# Patient Record
Sex: Female | Born: 1985 | Race: White | Hispanic: No
Health system: Midwestern US, Academic
[De-identification: ages and names within clinical notes are randomized; demographics above are authoritative.]

---

## 2017-03-14 ENCOUNTER — Encounter: Admit: 2017-03-14 | Discharge: 2017-03-14

## 2017-05-10 ENCOUNTER — Encounter: Admit: 2017-05-10 | Discharge: 2017-05-10

## 2017-05-10 DIAGNOSIS — I2510 Atherosclerotic heart disease of native coronary artery without angina pectoris: ICD-10-CM

## 2017-05-10 DIAGNOSIS — F909 Attention-deficit hyperactivity disorder, unspecified type: ICD-10-CM

## 2017-05-10 DIAGNOSIS — F419 Anxiety disorder, unspecified: ICD-10-CM

## 2017-05-10 DIAGNOSIS — IMO0002 Unspecified mental or behavioral problem: ICD-10-CM

## 2017-05-10 DIAGNOSIS — R011 Cardiac murmur, unspecified: Principal | ICD-10-CM

## 2017-05-10 DIAGNOSIS — F329 Major depressive disorder, single episode, unspecified: ICD-10-CM

## 2017-05-10 DIAGNOSIS — R6889 Other general symptoms and signs: ICD-10-CM

## 2017-05-10 DIAGNOSIS — E282 Polycystic ovarian syndrome: ICD-10-CM

## 2017-05-10 DIAGNOSIS — F3181 Bipolar II disorder: ICD-10-CM

## 2017-05-10 DIAGNOSIS — R7303 Prediabetes: ICD-10-CM

## 2017-05-10 NOTE — Telephone Encounter
Date verified: 05/10/17  Verification Source/Phone# (if applicable): [phone] PAT  Reference #: [id number]  In Network for Facility: YES  Ins Plan and ID#: [place] ID # [id number]  Group #: [id number]  Subscriber name: ALIEGHA PAULLIN  Subscriber DOB: 13-Jan-1986  Effective Date: 01/13/14  Deductible: NA                       Amount Met:  NA  OOP:  NA                                  Amount Met: NA  Coinsurance rate: 100% CONTRACTED RATE   OV/Spec Copay: NA                                            Applied to OOP: Y/N NO  LTM (if applicable): UNLIMITED  Auth required: Y/N YES  CPT/HCPCS: 29924  [id number]  Name of drug/procedure: [place] for Auth/Referral:[facility] [place]   Phone (if applies): [phone]   [phone]  Auth #: NA  Authorization Dates to/from: NA

## 2017-07-30 ENCOUNTER — Encounter: Admit: 2017-07-30 | Discharge: 2017-07-30

## 2017-07-30 ENCOUNTER — Ambulatory Visit: Admit: 2017-07-30 | Discharge: 2017-07-30 | Payer: MEDICAID

## 2017-07-30 DIAGNOSIS — F3181 Bipolar II disorder: ICD-10-CM

## 2017-07-30 DIAGNOSIS — F909 Attention-deficit hyperactivity disorder, unspecified type: ICD-10-CM

## 2017-07-30 DIAGNOSIS — R7303 Prediabetes: ICD-10-CM

## 2017-07-30 DIAGNOSIS — E282 Polycystic ovarian syndrome: ICD-10-CM

## 2017-07-30 DIAGNOSIS — IMO0002 Unspecified mental or behavioral problem: ICD-10-CM

## 2017-07-30 DIAGNOSIS — I2510 Atherosclerotic heart disease of native coronary artery without angina pectoris: ICD-10-CM

## 2017-07-30 DIAGNOSIS — K219 Gastro-esophageal reflux disease without esophagitis: ICD-10-CM

## 2017-07-30 DIAGNOSIS — R6889 Other general symptoms and signs: ICD-10-CM

## 2017-07-30 DIAGNOSIS — R011 Cardiac murmur, unspecified: Principal | ICD-10-CM

## 2017-07-30 DIAGNOSIS — F419 Anxiety disorder, unspecified: ICD-10-CM

## 2017-07-30 DIAGNOSIS — F329 Major depressive disorder, single episode, unspecified: ICD-10-CM

## 2017-08-16 ENCOUNTER — Ambulatory Visit: Admit: 2017-08-16 | Discharge: 2017-08-16 | Payer: MEDICAID

## 2017-08-16 ENCOUNTER — Encounter: Admit: 2017-08-16 | Discharge: 2017-08-16

## 2017-08-16 DIAGNOSIS — F4310 Post-traumatic stress disorder, unspecified: ICD-10-CM

## 2017-08-16 DIAGNOSIS — F319 Bipolar disorder, unspecified: ICD-10-CM

## 2017-08-16 DIAGNOSIS — Z7189 Other specified counseling: Principal | ICD-10-CM

## 2017-10-02 ENCOUNTER — Encounter: Admit: 2017-10-02 | Discharge: 2017-10-02

## 2017-10-23 ENCOUNTER — Encounter: Admit: 2017-10-23 | Discharge: 2017-10-23

## 2017-10-30 ENCOUNTER — Encounter: Admit: 2017-10-30 | Discharge: 2017-10-30

## 2017-11-15 ENCOUNTER — Ambulatory Visit: Admit: 2017-11-15 | Discharge: 2017-11-15 | Payer: MEDICAID

## 2017-11-15 DIAGNOSIS — Z7189 Other specified counseling: Principal | ICD-10-CM

## 2017-11-15 DIAGNOSIS — F4310 Post-traumatic stress disorder, unspecified: ICD-10-CM

## 2017-11-15 DIAGNOSIS — F319 Bipolar disorder, unspecified: ICD-10-CM

## 2017-11-20 ENCOUNTER — Encounter: Admit: 2017-11-20 | Discharge: 2017-11-20

## 2017-11-28 ENCOUNTER — Encounter: Admit: 2017-11-28 | Discharge: 2017-11-28

## 2017-11-28 DIAGNOSIS — Z9884 Bariatric surgery status: Principal | ICD-10-CM

## 2017-12-17 ENCOUNTER — Encounter: Admit: 2017-12-17 | Discharge: 2017-12-17

## 2017-12-17 DIAGNOSIS — Z01818 Encounter for other preprocedural examination: Principal | ICD-10-CM

## 2017-12-17 MED ORDER — CEFAZOLIN INJ 1GM IVP
3 g | Freq: Once | INTRAVENOUS | 0 refills | Status: CN
Start: 2017-12-17 — End: ?

## 2017-12-17 MED ORDER — FAMOTIDINE (PF) 20 MG/2 ML IV SOLN
20 mg | Freq: Once | INTRAVENOUS | 0 refills | Status: CN
Start: 2017-12-17 — End: ?

## 2017-12-17 MED ORDER — ACETAMINOPHEN 10 MG/ML IVPB
1000 mg | Freq: Once | INTRAVENOUS | 0 refills | Status: CN
Start: 2017-12-17 — End: ?

## 2018-01-07 ENCOUNTER — Encounter: Admit: 2018-01-07 | Discharge: 2018-01-07

## 2018-01-07 ENCOUNTER — Ambulatory Visit: Admit: 2018-01-07 | Discharge: 2018-01-08 | Payer: MEDICAID

## 2018-01-07 ENCOUNTER — Ambulatory Visit: Admit: 2018-01-07 | Discharge: 2018-01-07

## 2018-01-07 ENCOUNTER — Inpatient Hospital Stay: Admit: 2018-01-07 | Discharge: 2018-01-07

## 2018-01-07 DIAGNOSIS — IMO0002 Unspecified mental or behavioral problem: ICD-10-CM

## 2018-01-07 DIAGNOSIS — F603 Borderline personality disorder: ICD-10-CM

## 2018-01-07 DIAGNOSIS — F419 Anxiety disorder, unspecified: ICD-10-CM

## 2018-01-07 DIAGNOSIS — E282 Polycystic ovarian syndrome: ICD-10-CM

## 2018-01-07 DIAGNOSIS — A4902 Methicillin resistant Staphylococcus aureus infection, unspecified site: ICD-10-CM

## 2018-01-07 DIAGNOSIS — R011 Cardiac murmur, unspecified: Principal | ICD-10-CM

## 2018-01-07 DIAGNOSIS — R6889 Other general symptoms and signs: ICD-10-CM

## 2018-01-07 DIAGNOSIS — R000 Tachycardia, unspecified: ICD-10-CM

## 2018-01-07 DIAGNOSIS — F4310 Post-traumatic stress disorder, unspecified: ICD-10-CM

## 2018-01-07 DIAGNOSIS — F329 Major depressive disorder, single episode, unspecified: ICD-10-CM

## 2018-01-07 DIAGNOSIS — R7303 Prediabetes: ICD-10-CM

## 2018-01-07 DIAGNOSIS — I2510 Atherosclerotic heart disease of native coronary artery without angina pectoris: ICD-10-CM

## 2018-01-07 DIAGNOSIS — K929 Disease of digestive system, unspecified: ICD-10-CM

## 2018-01-07 DIAGNOSIS — F909 Attention-deficit hyperactivity disorder, unspecified type: ICD-10-CM

## 2018-01-07 DIAGNOSIS — Z87898 Personal history of other specified conditions: ICD-10-CM

## 2018-01-07 DIAGNOSIS — Z01818 Encounter for other preprocedural examination: Secondary | ICD-10-CM

## 2018-01-07 LAB — COMPREHENSIVE METABOLIC PANEL
Lab: 0.4 mg/dL (ref 0.3–1.2)
Lab: 0.5 mg/dL — ABNORMAL LOW (ref >60)
Lab: 103 MMOL/L — ABNORMAL HIGH (ref 98–110)
Lab: 12 U/L (ref 7–40)
Lab: 137 MMOL/L (ref 137–147)
Lab: 21 mg/dL — ABNORMAL LOW (ref >60)
Lab: 3.6 MMOL/L — ABNORMAL LOW (ref 3.5–5.1)
Lab: 4.4 g/dL (ref 3.5–5.0)
Lab: 7.7 g/dL (ref 6.0–8.0)
Lab: 83 mg/dL — ABNORMAL HIGH (ref 70–100)
Lab: 9.9 mg/dL (ref 8.5–10.6)
Lab: 97 U/L (ref 25–110)

## 2018-01-07 LAB — CBC
Lab: 13 g/dL (ref 12.0–15.0)
Lab: 14 % (ref 11–15)
Lab: 29 pg (ref 26–34)
Lab: 33 g/dL (ref 32.0–36.0)
Lab: 4.4 M/UL (ref 4.0–5.0)
Lab: 40 % (ref 36–45)
Lab: 8.1 FL (ref 7–11)
Lab: 89 FL (ref 80–100)
Lab: 9.9 10*3/uL (ref 4.5–11.0)

## 2018-01-08 ENCOUNTER — Encounter: Admit: 2018-01-08 | Discharge: 2018-01-08

## 2018-01-08 DIAGNOSIS — Z6841 Body Mass Index (BMI) 40.0 and over, adult: ICD-10-CM

## 2018-01-08 DIAGNOSIS — Z87891 Personal history of nicotine dependence: Principal | ICD-10-CM

## 2018-01-08 DIAGNOSIS — Z01818 Encounter for other preprocedural examination: ICD-10-CM

## 2018-01-08 LAB — IRON + BINDING CAPACITY + %SAT+ FERRITIN
Lab: 14 % — ABNORMAL LOW (ref 28–42)
Lab: 54 ng/mL (ref 10–200)
Lab: 59 ug/dL (ref 50–160)

## 2018-01-08 LAB — VITAMIN B12: Lab: 230 pg/mL (ref 180–914)

## 2018-01-08 LAB — FOLATE, SERUM: Lab: 11 ng/mL — ABNORMAL HIGH (ref >3.9)

## 2018-01-09 LAB — MRSA SCREEN

## 2018-01-09 LAB — 25-OH VITAMIN D (D2 + D3): Lab: 18 ng/mL — ABNORMAL LOW (ref 30–80)

## 2018-01-11 ENCOUNTER — Ambulatory Visit: Admit: 2018-01-11 | Discharge: 2018-01-12 | Payer: MEDICAID

## 2018-01-12 DIAGNOSIS — G4733 Obstructive sleep apnea (adult) (pediatric): Principal | ICD-10-CM

## 2018-01-13 ENCOUNTER — Encounter: Admit: 2018-01-13 | Discharge: 2018-01-13

## 2018-01-15 ENCOUNTER — Encounter: Admit: 2018-01-15 | Discharge: 2018-01-15

## 2018-01-17 ENCOUNTER — Encounter: Admit: 2018-01-17 | Discharge: 2018-01-17

## 2018-01-17 DIAGNOSIS — F419 Anxiety disorder, unspecified: ICD-10-CM

## 2018-01-17 DIAGNOSIS — R6889 Other general symptoms and signs: ICD-10-CM

## 2018-01-17 DIAGNOSIS — R7303 Prediabetes: ICD-10-CM

## 2018-01-17 DIAGNOSIS — F603 Borderline personality disorder: ICD-10-CM

## 2018-01-17 DIAGNOSIS — E282 Polycystic ovarian syndrome: ICD-10-CM

## 2018-01-17 DIAGNOSIS — A4902 Methicillin resistant Staphylococcus aureus infection, unspecified site: ICD-10-CM

## 2018-01-17 DIAGNOSIS — R000 Tachycardia, unspecified: ICD-10-CM

## 2018-01-17 DIAGNOSIS — I2510 Atherosclerotic heart disease of native coronary artery without angina pectoris: ICD-10-CM

## 2018-01-17 DIAGNOSIS — K929 Disease of digestive system, unspecified: ICD-10-CM

## 2018-01-17 DIAGNOSIS — F909 Attention-deficit hyperactivity disorder, unspecified type: ICD-10-CM

## 2018-01-17 DIAGNOSIS — F4310 Post-traumatic stress disorder, unspecified: ICD-10-CM

## 2018-01-17 DIAGNOSIS — R011 Cardiac murmur, unspecified: Principal | ICD-10-CM

## 2018-01-17 DIAGNOSIS — IMO0002 Unspecified mental or behavioral problem: ICD-10-CM

## 2018-01-17 DIAGNOSIS — F329 Major depressive disorder, single episode, unspecified: ICD-10-CM

## 2018-01-20 ENCOUNTER — Encounter: Admit: 2018-01-20 | Discharge: 2018-01-20

## 2018-01-20 ENCOUNTER — Inpatient Hospital Stay: Admit: 2018-01-20 | Discharge: 2018-01-20

## 2018-01-20 ENCOUNTER — Inpatient Hospital Stay: Admit: 2018-01-20 | Discharge: 2018-01-22 | Disposition: A | Discharge status: Home / Self Care | Payer: MEDICAID

## 2018-01-20 DIAGNOSIS — R011 Cardiac murmur, unspecified: Principal | ICD-10-CM

## 2018-01-20 DIAGNOSIS — F419 Anxiety disorder, unspecified: ICD-10-CM

## 2018-01-20 DIAGNOSIS — F909 Attention-deficit hyperactivity disorder, unspecified type: ICD-10-CM

## 2018-01-20 DIAGNOSIS — E282 Polycystic ovarian syndrome: ICD-10-CM

## 2018-01-20 DIAGNOSIS — R7303 Prediabetes: ICD-10-CM

## 2018-01-20 DIAGNOSIS — IMO0002 Unspecified mental or behavioral problem: ICD-10-CM

## 2018-01-20 DIAGNOSIS — R6889 Other general symptoms and signs: ICD-10-CM

## 2018-01-20 DIAGNOSIS — I2510 Atherosclerotic heart disease of native coronary artery without angina pectoris: ICD-10-CM

## 2018-01-20 DIAGNOSIS — K929 Disease of digestive system, unspecified: ICD-10-CM

## 2018-01-20 DIAGNOSIS — F329 Major depressive disorder, single episode, unspecified: ICD-10-CM

## 2018-01-20 DIAGNOSIS — F603 Borderline personality disorder: ICD-10-CM

## 2018-01-20 DIAGNOSIS — A4902 Methicillin resistant Staphylococcus aureus infection, unspecified site: ICD-10-CM

## 2018-01-20 DIAGNOSIS — F4310 Post-traumatic stress disorder, unspecified: ICD-10-CM

## 2018-01-20 DIAGNOSIS — R000 Tachycardia, unspecified: ICD-10-CM

## 2018-01-20 MED ORDER — MIDAZOLAM 1 MG/ML IJ SOLN
INTRAVENOUS | 0 refills | Status: DC
Start: 2018-01-20 — End: 2018-01-20
  Administered 2018-01-20: 18:00:00 2 mg via INTRAVENOUS

## 2018-01-20 MED ORDER — LACTATED RINGERS IV SOLP
INTRAVENOUS | 0 refills | Status: DC
Start: 2018-01-20 — End: 2018-01-20
  Administered 2018-01-20 (×3): 1000.000 mL via INTRAVENOUS

## 2018-01-20 MED ORDER — MORPHINE 4 MG/ML IV CRTG
2-6 mg | INTRAVENOUS | 0 refills | Status: DC | PRN
Start: 2018-01-20 — End: 2018-01-22
  Administered 2018-01-20 – 2018-01-21 (×3): 4 mg via INTRAVENOUS

## 2018-01-20 MED ORDER — NALOXONE 0.4 MG/ML IJ SOLN
.4 mg | INTRAVENOUS | 0 refills | Status: DC | PRN
Start: 2018-01-20 — End: 2018-01-22

## 2018-01-20 MED ORDER — DIPHENHYDRAMINE HCL 50 MG/ML IJ SOLN
25 mg | Freq: Once | INTRAVENOUS | 0 refills | Status: DC | PRN
Start: 2018-01-20 — End: 2018-01-20

## 2018-01-20 MED ORDER — PROMETHAZINE 25 MG/ML IJ SOLN
6.25 mg | INTRAVENOUS | 0 refills | Status: DC | PRN
Start: 2018-01-20 — End: 2018-01-22
  Administered 2018-01-21: 04:00:00 6.25 mg via INTRAVENOUS

## 2018-01-20 MED ORDER — HYDRALAZINE 20 MG/ML IJ SOLN
10 mg | INTRAVENOUS | 0 refills | Status: DC | PRN
Start: 2018-01-20 — End: 2018-01-22

## 2018-01-20 MED ORDER — LURASIDONE 60 MG PO TAB
60 mg | Freq: Every day | ORAL | 0 refills | Status: DC
Start: 2018-01-20 — End: 2018-01-22

## 2018-01-20 MED ORDER — LURASIDONE 20 MG PO TAB
20 mg | Freq: Every day | ORAL | 0 refills | Status: DC
Start: 2018-01-20 — End: 2018-01-22
  Administered 2018-01-21: 21:00:00 20 mg via ORAL

## 2018-01-20 MED ORDER — SODIUM CHLORIDE 0.9 % IV SOLP
INTRAVENOUS | 0 refills | Status: DC
Start: 2018-01-20 — End: 2018-01-22
  Administered 2018-01-20 – 2018-01-22 (×5): 1000.000 mL via INTRAVENOUS

## 2018-01-20 MED ORDER — KETAMINE 10 MG/ML IJ SOLN
0 refills | Status: DC
Start: 2018-01-20 — End: 2018-01-20
  Administered 2018-01-20: 19:00:00 30 mg via INTRAVENOUS

## 2018-01-20 MED ORDER — ONDANSETRON HCL (PF) 4 MG/2 ML IJ SOLN
4 mg | INTRAVENOUS | 0 refills | Status: DC | PRN
Start: 2018-01-20 — End: 2018-01-22
  Administered 2018-01-21 (×4): 4 mg via INTRAVENOUS

## 2018-01-20 MED ORDER — LABETALOL 5 MG/ML IV SYRG
0 refills | Status: DC
Start: 2018-01-20 — End: 2018-01-20
  Administered 2018-01-20 (×4): 5 mg via INTRAVENOUS

## 2018-01-20 MED ORDER — SUGAMMADEX 100 MG/ML IV SOLN
INTRAVENOUS | 0 refills | Status: DC
Start: 2018-01-20 — End: 2018-01-20
  Administered 2018-01-20: 20:00:00 280 mg via INTRAVENOUS

## 2018-01-20 MED ORDER — ROCURONIUM 10 MG/ML IV SOLN
INTRAVENOUS | 0 refills | Status: DC
Start: 2018-01-20 — End: 2018-01-20
  Administered 2018-01-20: 18:00:00 40 mg via INTRAVENOUS
  Administered 2018-01-20 (×2): 10 mg via INTRAVENOUS

## 2018-01-20 MED ORDER — PROPOFOL 10 MG/ML IV EMUL (INFUSION)(AM)(OR)
0 refills | Status: DC
Start: 2018-01-20 — End: 2018-01-20
  Administered 2018-01-20: 18:00:00 150 ug/kg/min via INTRAVENOUS

## 2018-01-20 MED ORDER — FAMOTIDINE (PF) 20 MG/2 ML IV SOLN
20 mg | Freq: Two times a day (BID) | INTRAVENOUS | 0 refills | Status: DC
Start: 2018-01-20 — End: 2018-01-22
  Administered 2018-01-21 – 2018-01-22 (×4): 20 mg via INTRAVENOUS

## 2018-01-20 MED ORDER — ONDANSETRON HCL (PF) 4 MG/2 ML IJ SOLN
INTRAVENOUS | 0 refills | Status: DC
Start: 2018-01-20 — End: 2018-01-20
  Administered 2018-01-20: 20:00:00 4 mg via INTRAVENOUS

## 2018-01-20 MED ORDER — PROPOFOL INJ 10 MG/ML IV VIAL
0 refills | Status: DC
Start: 2018-01-20 — End: 2018-01-20
  Administered 2018-01-20: 18:00:00 200 mg via INTRAVENOUS

## 2018-01-20 MED ORDER — HYDRALAZINE 20 MG/ML IJ SOLN
0 refills | Status: DC
Start: 2018-01-20 — End: 2018-01-20
  Administered 2018-01-20: 20:00:00 5 mg via INTRAVENOUS
  Administered 2018-01-20: 20:00:00 10 mg via INTRAVENOUS

## 2018-01-20 MED ORDER — SIMETHICONE 80 MG PO CHEW
80 mg | ORAL | 0 refills | Status: DC | PRN
Start: 2018-01-20 — End: 2018-01-22

## 2018-01-20 MED ORDER — MORPHINE 2 MG/ML IV CRTG
2-6 mg | INTRAVENOUS | 0 refills | Status: DC | PRN
Start: 2018-01-20 — End: 2018-01-20

## 2018-01-20 MED ORDER — METOCLOPRAMIDE HCL 5 MG/ML IJ SOLN
10 mg | Freq: Once | INTRAVENOUS | 0 refills | Status: DC | PRN
Start: 2018-01-20 — End: 2018-01-20

## 2018-01-20 MED ORDER — OXYCODONE 5 MG/5 ML PO SOLN
5-10 mg | ORAL | 0 refills | Status: DC | PRN
Start: 2018-01-20 — End: 2018-01-22
  Administered 2018-01-21 (×2): 10 mg via ORAL
  Administered 2018-01-21: 05:00:00 5 mg via ORAL
  Administered 2018-01-21: 12:00:00 10 mg via ORAL

## 2018-01-20 MED ORDER — LABETALOL 5 MG/ML IV SYRG
10 mg | INTRAVENOUS | 0 refills | Status: DC | PRN
Start: 2018-01-20 — End: 2018-01-22

## 2018-01-20 MED ORDER — SCOPOLAMINE BASE 1 MG OVER 3 DAYS TD PT3D
1 | Freq: Once | TRANSDERMAL | 0 refills | Status: DC
Start: 2018-01-20 — End: 2018-01-22

## 2018-01-20 MED ORDER — ONDANSETRON 4 MG PO TBDI
4 mg | ORAL | 0 refills | Status: DC | PRN
Start: 2018-01-20 — End: 2018-01-22

## 2018-01-20 MED ORDER — ENOXAPARIN 40 MG/0.4 ML SC SYRG
40 mg | Freq: Two times a day (BID) | SUBCUTANEOUS | 0 refills | Status: DC
Start: 2018-01-20 — End: 2018-01-22
  Administered 2018-01-21 – 2018-01-22 (×3): 40 mg via SUBCUTANEOUS

## 2018-01-20 MED ORDER — FENTANYL CITRATE (PF) 50 MCG/ML IJ SOLN
25 ug | INTRAVENOUS | 0 refills | Status: DC | PRN
Start: 2018-01-20 — End: 2018-01-20

## 2018-01-20 MED ORDER — FENTANYL CITRATE (PF) 50 MCG/ML IJ SOLN
0 refills | Status: DC
Start: 2018-01-20 — End: 2018-01-20
  Administered 2018-01-20 (×2): 50 ug via INTRAVENOUS
  Administered 2018-01-20: 18:00:00 100 ug via INTRAVENOUS

## 2018-01-20 MED ORDER — HYDROMORPHONE (PF) 2 MG/ML IJ SYRG
.5-1 mg | INTRAVENOUS | 0 refills | Status: DC | PRN
Start: 2018-01-20 — End: 2018-01-20
  Administered 2018-01-20 (×2): 1 mg via INTRAVENOUS

## 2018-01-20 MED ORDER — DIPHENHYDRAMINE HCL 50 MG/ML IJ SOLN
25 mg | INTRAVENOUS | 0 refills | Status: DC | PRN
Start: 2018-01-20 — End: 2018-01-22

## 2018-01-20 MED ORDER — SODIUM CHLORIDE 0.9 % IV SOLP
1000 mL | INTRAVENOUS | 0 refills | Status: AC
Start: 2018-01-20 — End: ?

## 2018-01-20 MED ORDER — LIDOCAINE (PF) 10 MG/ML (1 %) IJ SOLN
.1-2 mL | Freq: Once | INTRAMUSCULAR | 0 refills | Status: CP
Start: 2018-01-20 — End: ?

## 2018-01-20 MED ORDER — FAMOTIDINE (PF) 20 MG/2 ML IV SOLN
20 mg | Freq: Once | INTRAVENOUS | 0 refills | Status: CP
Start: 2018-01-20 — End: ?
  Administered 2018-01-20: 17:00:00 20 mg via INTRAVENOUS

## 2018-01-20 MED ORDER — PROMETHAZINE 25 MG PO TAB
25 mg | ORAL | 0 refills | Status: DC | PRN
Start: 2018-01-20 — End: 2018-01-22

## 2018-01-20 MED ORDER — PROPOFOL 10 MG/ML IV EMUL 50 ML (INFUSION)(AM)(OR)
0 refills | Status: DC
Start: 2018-01-20 — End: 2018-01-20
  Administered 2018-01-20: 19:00:00 100 ug/kg/min via INTRAVENOUS

## 2018-01-20 MED ORDER — ACETAMINOPHEN 1,000 MG/100 ML (10 MG/ML) IV SOLN
1000 mg | Freq: Once | INTRAVENOUS | 0 refills | Status: CP
Start: 2018-01-20 — End: ?
  Administered 2018-01-21: 02:00:00 1000 mg via INTRAVENOUS

## 2018-01-20 MED ORDER — LIDOCAINE (PF) 200 MG/10 ML (2 %) IJ SYRG
0 refills | Status: DC
Start: 2018-01-20 — End: 2018-01-20
  Administered 2018-01-20: 18:00:00 100 mg via INTRAVENOUS

## 2018-01-20 MED ORDER — OXYCODONE 5 MG PO TAB
5-10 mg | Freq: Once | ORAL | 0 refills | Status: DC | PRN
Start: 2018-01-20 — End: 2018-01-20

## 2018-01-20 MED ORDER — CEFAZOLIN INJ 1GM IVP
3 g | Freq: Once | INTRAVENOUS | 0 refills | Status: CP
Start: 2018-01-20 — End: ?
  Administered 2018-01-20: 19:00:00 3 g via INTRAVENOUS

## 2018-01-20 MED ORDER — BUPIVACAINE 0.25 % (2.5 MG/ML) IJ SOLN
0 refills | Status: DC
Start: 2018-01-20 — End: 2018-01-20
  Administered 2018-01-20: 20:00:00 50 mL via INTRAMUSCULAR

## 2018-01-20 MED ORDER — SCOPOLAMINE BASE 1 MG OVER 3 DAYS TD PT3D
1 | TRANSDERMAL | 0 refills | Status: DC
Start: 2018-01-20 — End: 2018-01-20

## 2018-01-20 MED ORDER — ACETAMINOPHEN 1,000 MG/100 ML (10 MG/ML) IV SOLN
1000 mg | Freq: Once | INTRAVENOUS | 0 refills | Status: CP
Start: 2018-01-20 — End: ?
  Administered 2018-01-21: 13:00:00 1000 mg via INTRAVENOUS

## 2018-01-20 MED ORDER — SODIUM CHLORIDE 0.9 % IV SOLP
0 refills | Status: CP
Start: 2018-01-20 — End: ?
  Administered 2018-01-20: 20:00:00 1000 mL

## 2018-01-20 MED ORDER — ACETAMINOPHEN 1,000 MG/100 ML (10 MG/ML) IV SOLN
1000 mg | Freq: Once | INTRAVENOUS | 0 refills | Status: CP
Start: 2018-01-20 — End: ?
  Administered 2018-01-21: 07:00:00 1000 mg via INTRAVENOUS

## 2018-01-20 MED ORDER — ACETAMINOPHEN 1,000 MG/100 ML (10 MG/ML) IV SOLN
1000 mg | Freq: Once | INTRAVENOUS | 0 refills | Status: CP
Start: 2018-01-20 — End: ?
  Administered 2018-01-20: 19:00:00 1000 mg via INTRAVENOUS

## 2018-01-20 MED ORDER — HALOPERIDOL LACTATE 5 MG/ML IJ SOLN
1 mg | Freq: Once | INTRAVENOUS | 0 refills | Status: CP | PRN
Start: 2018-01-20 — End: ?
  Administered 2018-01-20: 20:00:00 1 mg via INTRAVENOUS

## 2018-01-20 MED ORDER — HEPARIN, PORCINE (PF) 5,000 UNIT/0.5 ML IJ SYRG
5000 [IU] | Freq: Once | SUBCUTANEOUS | 0 refills | Status: CP
Start: 2018-01-20 — End: ?
  Administered 2018-01-20: 17:00:00 5000 [IU] via SUBCUTANEOUS

## 2018-01-20 MED ORDER — DEXMEDETOMIDINE IN 0.9 % NACL 80 MCG/20 ML (4 MCG/ML) IV SOLN
0 refills | Status: DC
Start: 2018-01-20 — End: 2018-01-20
  Administered 2018-01-20 (×2): 10 ug via INTRAVENOUS

## 2018-01-20 MED ADMIN — LIDOCAINE (PF) 10 MG/ML (1 %) IJ SOLN [95838]: 0.2 mL | INTRAMUSCULAR | @ 16:00:00 | Stop: 2018-01-20 | NDC 63323049227

## 2018-01-20 MED ADMIN — SCOPOLAMINE BASE 1 MG OVER 3 DAYS TD PT3D [82141]: 1 | TRANSDERMAL | @ 17:00:00 | Stop: 2018-01-20 | NDC 10019055390

## 2018-01-21 ENCOUNTER — Encounter: Admit: 2018-01-21 | Discharge: 2018-01-21

## 2018-01-21 LAB — CBC: Lab: 11 10*3/uL — ABNORMAL HIGH (ref 4.5–11.0)

## 2018-01-21 MED ORDER — OXYCODONE 5 MG/5 ML PO SOLN
5-10 mg | ORAL | 0 refills | Status: CN | PRN
Start: 2018-01-21 — End: ?

## 2018-01-22 ENCOUNTER — Encounter: Admit: 2018-01-22 | Discharge: 2018-01-22

## 2018-01-22 DIAGNOSIS — F909 Attention-deficit hyperactivity disorder, unspecified type: ICD-10-CM

## 2018-01-22 DIAGNOSIS — E282 Polycystic ovarian syndrome: ICD-10-CM

## 2018-01-22 DIAGNOSIS — Z6841 Body Mass Index (BMI) 40.0 and over, adult: ICD-10-CM

## 2018-01-22 DIAGNOSIS — Z9049 Acquired absence of other specified parts of digestive tract: ICD-10-CM

## 2018-01-22 DIAGNOSIS — F410 Panic disorder [episodic paroxysmal anxiety] without agoraphobia: ICD-10-CM

## 2018-01-22 DIAGNOSIS — K219 Gastro-esophageal reflux disease without esophagitis: ICD-10-CM

## 2018-01-22 DIAGNOSIS — R000 Tachycardia, unspecified: ICD-10-CM

## 2018-01-22 DIAGNOSIS — I2510 Atherosclerotic heart disease of native coronary artery without angina pectoris: ICD-10-CM

## 2018-01-22 DIAGNOSIS — R6889 Other general symptoms and signs: ICD-10-CM

## 2018-01-22 DIAGNOSIS — F4310 Post-traumatic stress disorder, unspecified: ICD-10-CM

## 2018-01-22 DIAGNOSIS — F329 Major depressive disorder, single episode, unspecified: ICD-10-CM

## 2018-01-22 DIAGNOSIS — F603 Borderline personality disorder: ICD-10-CM

## 2018-01-22 DIAGNOSIS — F419 Anxiety disorder, unspecified: ICD-10-CM

## 2018-01-22 DIAGNOSIS — K929 Disease of digestive system, unspecified: ICD-10-CM

## 2018-01-22 DIAGNOSIS — R7303 Prediabetes: ICD-10-CM

## 2018-01-22 DIAGNOSIS — R011 Cardiac murmur, unspecified: Principal | ICD-10-CM

## 2018-01-22 DIAGNOSIS — A4902 Methicillin resistant Staphylococcus aureus infection, unspecified site: ICD-10-CM

## 2018-01-22 DIAGNOSIS — IMO0002 Unspecified mental or behavioral problem: ICD-10-CM

## 2018-01-22 MED ORDER — OTHER MEDICATION
ORAL_TABLET | Freq: Two times a day (BID) | RESPIRATORY_TRACT | 3 refills | 57.00000 days | Status: AC
Start: 2018-01-22 — End: 2018-06-20

## 2018-01-22 MED ORDER — OXYCODONE 5 MG PO TAB
5 mg | ORAL_TABLET | ORAL | 0 refills | 6.00000 days | Status: AC | PRN
Start: 2018-01-22 — End: 2018-01-31
  Filled 2018-01-22 (×2): qty 30, 5d supply, fill #1

## 2018-01-22 MED ORDER — FAMOTIDINE-CA CARB-MAG HYDROX 10-800-165 MG PO CHEW
1 | ORAL_TABLET | Freq: Two times a day (BID) | ORAL | 0 refills | 90.00000 days | Status: AC
Start: 2018-01-22 — End: ?

## 2018-01-22 MED ORDER — OXYCODONE 5 MG PO TAB
5-10 mg | ORAL | 0 refills | Status: DC | PRN
Start: 2018-01-22 — End: 2018-01-22
  Administered 2018-01-22: 13:00:00 10 mg via ORAL

## 2018-01-22 MED ORDER — ONDANSETRON 4 MG PO TBDI
4 mg | ORAL_TABLET | ORAL | 0 refills | 8.00000 days | Status: AC | PRN
Start: 2018-01-22 — End: 2018-01-31
  Filled 2018-01-22 (×2): qty 30, 8d supply, fill #1

## 2018-01-22 MED ORDER — CYANOCOBALAMIN (VITAMIN B-12) 500 MCG PO LOZG
500 ug | LOZENGE | Freq: Every day | ORAL | 3 refills | 29.00000 days | Status: AC
Start: 2018-01-22 — End: 2018-02-21

## 2018-01-23 ENCOUNTER — Ambulatory Visit: Admit: 2018-01-23 | Discharge: 2018-01-24 | Payer: MEDICAID

## 2018-01-23 ENCOUNTER — Encounter: Admit: 2018-01-23 | Discharge: 2018-01-23

## 2018-01-23 DIAGNOSIS — E282 Polycystic ovarian syndrome: ICD-10-CM

## 2018-01-23 DIAGNOSIS — R000 Tachycardia, unspecified: ICD-10-CM

## 2018-01-23 DIAGNOSIS — IMO0002 Unspecified mental or behavioral problem: ICD-10-CM

## 2018-01-23 DIAGNOSIS — F4310 Post-traumatic stress disorder, unspecified: ICD-10-CM

## 2018-01-23 DIAGNOSIS — R6889 Other general symptoms and signs: ICD-10-CM

## 2018-01-23 DIAGNOSIS — F419 Anxiety disorder, unspecified: ICD-10-CM

## 2018-01-23 DIAGNOSIS — I2510 Atherosclerotic heart disease of native coronary artery without angina pectoris: ICD-10-CM

## 2018-01-23 DIAGNOSIS — R7303 Prediabetes: ICD-10-CM

## 2018-01-23 DIAGNOSIS — E860 Dehydration: Principal | ICD-10-CM

## 2018-01-23 DIAGNOSIS — A4902 Methicillin resistant Staphylococcus aureus infection, unspecified site: ICD-10-CM

## 2018-01-23 DIAGNOSIS — F329 Major depressive disorder, single episode, unspecified: ICD-10-CM

## 2018-01-23 DIAGNOSIS — R011 Cardiac murmur, unspecified: Principal | ICD-10-CM

## 2018-01-23 DIAGNOSIS — K929 Disease of digestive system, unspecified: ICD-10-CM

## 2018-01-23 DIAGNOSIS — F603 Borderline personality disorder: ICD-10-CM

## 2018-01-23 DIAGNOSIS — F909 Attention-deficit hyperactivity disorder, unspecified type: ICD-10-CM

## 2018-01-23 MED ORDER — PROMETHAZINE 25 MG/ML IJ SOLN
25 mg | Freq: Once | INTRAVENOUS | 0 refills | Status: CN
Start: 2018-01-23 — End: ?

## 2018-01-23 MED ORDER — THIAMINE HCL (VITAMIN B1) 100 MG/ML IJ SOLN
100 mg | Freq: Once | INTRAVENOUS | 0 refills | Status: CN
Start: 2018-01-23 — End: ?

## 2018-01-23 MED ORDER — PROMETHAZINE 12.5 MG PO TAB
12.5 mg | ORAL_TABLET | ORAL | 1 refills | 7.00000 days | Status: AC | PRN
Start: 2018-01-23 — End: 2018-01-23

## 2018-01-23 MED ORDER — SODIUM CHLORIDE 0.9 % IV SOLP
2000 mL | Freq: Once | INTRAVENOUS | 0 refills | Status: CN
Start: 2018-01-23 — End: ?

## 2018-01-23 MED ORDER — SODIUM CHLORIDE 0.9 % IV SOLP
2000 mL | Freq: Once | INTRAVENOUS | 0 refills | Status: CP
Start: 2018-01-23 — End: ?
  Administered 2018-01-23: 18:00:00 2000 mL via INTRAVENOUS

## 2018-01-23 MED ORDER — PRIOR AUTHORIZATION PLACEHOLDER (DO NOT RELEASE)
Freq: Once | 0 refills | Status: CN
Start: 2018-01-23 — End: ?

## 2018-01-23 MED ORDER — PROMETHAZINE 12.5 MG PO TAB
12.5 mg | ORAL_TABLET | ORAL | 1 refills | 7.00000 days | Status: AC | PRN
Start: 2018-01-23 — End: 2018-02-21
  Filled 2018-01-23 (×2): qty 30, 8d supply, fill #1

## 2018-01-23 MED ORDER — PROMETHAZINE 25 MG/ML IJ SOLN
25 mg | Freq: Once | INTRAVENOUS | 0 refills | Status: CP
Start: 2018-01-23 — End: ?
  Administered 2018-01-23: 18:00:00 25 mg via INTRAVENOUS

## 2018-01-23 MED ORDER — THIAMINE HCL (VITAMIN B1) 100 MG/ML IJ SOLN
100 mg | Freq: Once | INTRAVENOUS | 0 refills | Status: CP
Start: 2018-01-23 — End: ?
  Administered 2018-01-23: 19:00:00 100 mg via INTRAVENOUS

## 2018-01-24 ENCOUNTER — Encounter: Admit: 2018-01-24 | Discharge: 2018-01-24

## 2018-01-24 ENCOUNTER — Ambulatory Visit: Admit: 2018-01-24 | Discharge: 2018-01-25 | Payer: MEDICAID

## 2018-01-24 DIAGNOSIS — E860 Dehydration: Principal | ICD-10-CM

## 2018-01-24 MED ORDER — SODIUM CHLORIDE 0.9 % IV SOLP
2000 mL | Freq: Once | INTRAVENOUS | 0 refills | Status: CP
Start: 2018-01-24 — End: ?

## 2018-01-24 MED ORDER — SODIUM CHLORIDE 0.9 % IV SOLP
2000 mL | Freq: Once | INTRAVENOUS | 0 refills | Status: CN
Start: 2018-01-24 — End: ?

## 2018-01-24 MED ORDER — PRIOR AUTHORIZATION PLACEHOLDER (DO NOT RELEASE)
Freq: Once | 0 refills | Status: CN
Start: 2018-01-24 — End: ?

## 2018-01-25 DIAGNOSIS — E860 Dehydration: Principal | ICD-10-CM

## 2018-01-27 ENCOUNTER — Encounter: Admit: 2018-01-27 | Discharge: 2018-01-27

## 2018-01-29 ENCOUNTER — Encounter: Admit: 2018-01-29 | Discharge: 2018-01-29

## 2018-01-31 ENCOUNTER — Encounter: Admit: 2018-01-31 | Discharge: 2018-01-31

## 2018-01-31 ENCOUNTER — Ambulatory Visit: Admit: 2018-01-31 | Discharge: 2018-02-01 | Payer: MEDICAID

## 2018-01-31 DIAGNOSIS — A4902 Methicillin resistant Staphylococcus aureus infection, unspecified site: ICD-10-CM

## 2018-01-31 DIAGNOSIS — F603 Borderline personality disorder: ICD-10-CM

## 2018-01-31 DIAGNOSIS — E282 Polycystic ovarian syndrome: ICD-10-CM

## 2018-01-31 DIAGNOSIS — F909 Attention-deficit hyperactivity disorder, unspecified type: ICD-10-CM

## 2018-01-31 DIAGNOSIS — F329 Major depressive disorder, single episode, unspecified: ICD-10-CM

## 2018-01-31 DIAGNOSIS — R011 Cardiac murmur, unspecified: Principal | ICD-10-CM

## 2018-01-31 DIAGNOSIS — K929 Disease of digestive system, unspecified: ICD-10-CM

## 2018-01-31 DIAGNOSIS — R000 Tachycardia, unspecified: ICD-10-CM

## 2018-01-31 DIAGNOSIS — F419 Anxiety disorder, unspecified: ICD-10-CM

## 2018-01-31 DIAGNOSIS — R7303 Prediabetes: ICD-10-CM

## 2018-01-31 DIAGNOSIS — R6889 Other general symptoms and signs: ICD-10-CM

## 2018-01-31 DIAGNOSIS — F4310 Post-traumatic stress disorder, unspecified: ICD-10-CM

## 2018-01-31 DIAGNOSIS — IMO0002 Unspecified mental or behavioral problem: ICD-10-CM

## 2018-01-31 DIAGNOSIS — I2510 Atherosclerotic heart disease of native coronary artery without angina pectoris: ICD-10-CM

## 2018-02-01 DIAGNOSIS — Z9884 Bariatric surgery status: Principal | ICD-10-CM

## 2018-02-06 ENCOUNTER — Encounter: Admit: 2018-02-06 | Discharge: 2018-02-06

## 2018-02-06 DIAGNOSIS — K920 Hematemesis: Principal | ICD-10-CM

## 2018-02-06 MED ORDER — ESOMEPRAZOLE MAGNESIUM 40 MG PO CPDR
40 mg | ORAL_CAPSULE | Freq: Two times a day (BID) | ORAL | 0 refills | Status: SS
Start: 2018-02-06 — End: 2018-02-24

## 2018-02-07 ENCOUNTER — Encounter: Admit: 2018-02-07 | Discharge: 2018-02-07

## 2018-02-09 ENCOUNTER — Encounter: Admit: 2018-02-09 | Discharge: 2018-02-10

## 2018-02-13 ENCOUNTER — Encounter: Admit: 2018-02-13 | Discharge: 2018-02-13

## 2018-02-18 ENCOUNTER — Encounter: Admit: 2018-02-18 | Discharge: 2018-02-18

## 2018-02-21 ENCOUNTER — Encounter: Admit: 2018-02-21 | Discharge: 2018-02-21

## 2018-02-21 ENCOUNTER — Ambulatory Visit: Admit: 2018-02-21 | Discharge: 2018-02-22 | Payer: MEDICAID

## 2018-02-21 DIAGNOSIS — K929 Disease of digestive system, unspecified: ICD-10-CM

## 2018-02-21 DIAGNOSIS — F4310 Post-traumatic stress disorder, unspecified: ICD-10-CM

## 2018-02-21 DIAGNOSIS — F419 Anxiety disorder, unspecified: ICD-10-CM

## 2018-02-21 DIAGNOSIS — F909 Attention-deficit hyperactivity disorder, unspecified type: ICD-10-CM

## 2018-02-21 DIAGNOSIS — F603 Borderline personality disorder: ICD-10-CM

## 2018-02-21 DIAGNOSIS — R1319 Other dysphagia: Principal | ICD-10-CM

## 2018-02-21 DIAGNOSIS — F329 Major depressive disorder, single episode, unspecified: ICD-10-CM

## 2018-02-21 DIAGNOSIS — R6889 Other general symptoms and signs: ICD-10-CM

## 2018-02-21 DIAGNOSIS — R7303 Prediabetes: ICD-10-CM

## 2018-02-21 DIAGNOSIS — E282 Polycystic ovarian syndrome: ICD-10-CM

## 2018-02-21 DIAGNOSIS — R000 Tachycardia, unspecified: ICD-10-CM

## 2018-02-21 DIAGNOSIS — I2510 Atherosclerotic heart disease of native coronary artery without angina pectoris: ICD-10-CM

## 2018-02-21 DIAGNOSIS — IMO0002 Unspecified mental or behavioral problem: ICD-10-CM

## 2018-02-21 DIAGNOSIS — R011 Cardiac murmur, unspecified: Principal | ICD-10-CM

## 2018-02-21 DIAGNOSIS — A4902 Methicillin resistant Staphylococcus aureus infection, unspecified site: ICD-10-CM

## 2018-02-21 MED ORDER — ONDANSETRON 8 MG PO TBDI
8 mg | ORAL_TABLET | ORAL | 1 refills | 8.00000 days | Status: AC | PRN
Start: 2018-02-21 — End: 2018-06-20

## 2018-02-22 DIAGNOSIS — R1319 Other dysphagia: ICD-10-CM

## 2018-02-22 DIAGNOSIS — Z9884 Bariatric surgery status: Principal | ICD-10-CM

## 2018-02-24 ENCOUNTER — Ambulatory Visit: Admit: 2018-02-24 | Discharge: 2018-02-24

## 2018-02-24 ENCOUNTER — Ambulatory Visit: Admit: 2018-02-24 | Discharge: 2018-02-24 | Payer: MEDICAID

## 2018-02-24 ENCOUNTER — Encounter: Admit: 2018-02-24 | Discharge: 2018-02-24

## 2018-02-24 DIAGNOSIS — Z9884 Bariatric surgery status: ICD-10-CM

## 2018-02-24 DIAGNOSIS — R7303 Prediabetes: ICD-10-CM

## 2018-02-24 DIAGNOSIS — R1319 Other dysphagia: Principal | ICD-10-CM

## 2018-02-24 DIAGNOSIS — Z79899 Other long term (current) drug therapy: ICD-10-CM

## 2018-02-24 DIAGNOSIS — F603 Borderline personality disorder: ICD-10-CM

## 2018-02-24 DIAGNOSIS — Z9049 Acquired absence of other specified parts of digestive tract: ICD-10-CM

## 2018-02-24 DIAGNOSIS — E282 Polycystic ovarian syndrome: ICD-10-CM

## 2018-02-24 DIAGNOSIS — Z6841 Body Mass Index (BMI) 40.0 and over, adult: ICD-10-CM

## 2018-02-24 DIAGNOSIS — K219 Gastro-esophageal reflux disease without esophagitis: ICD-10-CM

## 2018-02-24 DIAGNOSIS — Z8614 Personal history of Methicillin resistant Staphylococcus aureus infection: ICD-10-CM

## 2018-02-24 DIAGNOSIS — I2510 Atherosclerotic heart disease of native coronary artery without angina pectoris: ICD-10-CM

## 2018-02-24 DIAGNOSIS — F909 Attention-deficit hyperactivity disorder, unspecified type: ICD-10-CM

## 2018-02-24 DIAGNOSIS — F329 Major depressive disorder, single episode, unspecified: ICD-10-CM

## 2018-02-24 DIAGNOSIS — Z882 Allergy status to sulfonamides status: ICD-10-CM

## 2018-02-24 DIAGNOSIS — F4310 Post-traumatic stress disorder, unspecified: ICD-10-CM

## 2018-02-24 DIAGNOSIS — Z87891 Personal history of nicotine dependence: ICD-10-CM

## 2018-02-24 DIAGNOSIS — IMO0002 Unspecified mental or behavioral problem: ICD-10-CM

## 2018-02-24 DIAGNOSIS — R011 Cardiac murmur, unspecified: Principal | ICD-10-CM

## 2018-02-24 DIAGNOSIS — Z886 Allergy status to analgesic agent status: ICD-10-CM

## 2018-02-24 DIAGNOSIS — K929 Disease of digestive system, unspecified: ICD-10-CM

## 2018-02-24 DIAGNOSIS — R000 Tachycardia, unspecified: ICD-10-CM

## 2018-02-24 DIAGNOSIS — A4902 Methicillin resistant Staphylococcus aureus infection, unspecified site: ICD-10-CM

## 2018-02-24 DIAGNOSIS — R6889 Other general symptoms and signs: ICD-10-CM

## 2018-02-24 DIAGNOSIS — F419 Anxiety disorder, unspecified: ICD-10-CM

## 2018-02-24 MED ORDER — FENTANYL CITRATE (PF) 50 MCG/ML IJ SOLN
25 ug | INTRAVENOUS | 0 refills | Status: DC | PRN
Start: 2018-02-24 — End: 2018-02-24

## 2018-02-24 MED ORDER — LIDOCAINE (PF) 200 MG/10 ML (2 %) IJ SYRG
0 refills | Status: DC
Start: 2018-02-24 — End: 2018-02-24

## 2018-02-24 MED ORDER — SUCRALFATE 1 GRAM PO TAB
1 g | ORAL_TABLET | ORAL | 3 refills | Status: AC
Start: 2018-02-24 — End: ?

## 2018-02-24 MED ORDER — SODIUM CHLORIDE 0.9 % IV SOLP
INTRAVENOUS | 0 refills | Status: CN
Start: 2018-02-24 — End: ?

## 2018-02-24 MED ORDER — LIDOCAINE (PF) 10 MG/ML (1 %) IJ SOLN
.1-2 mL | INTRAMUSCULAR | 0 refills | Status: DC | PRN
Start: 2018-02-24 — End: 2018-02-24

## 2018-02-24 MED ORDER — PROPOFOL INJ 10 MG/ML IV VIAL
0 refills | Status: DC
Start: 2018-02-24 — End: 2018-02-24
  Administered 2018-02-24 (×5): 50 mg via INTRAVENOUS
  Administered 2018-02-24: 14:00:00 25 mg via INTRAVENOUS
  Administered 2018-02-24 (×2): 100 mg via INTRAVENOUS
  Administered 2018-02-24: 14:00:00 25 mg via INTRAVENOUS
  Administered 2018-02-24 (×2): 50 mg via INTRAVENOUS

## 2018-02-24 MED ORDER — ONDANSETRON HCL (PF) 4 MG/2 ML IJ SOLN
4 mg | Freq: Once | INTRAVENOUS | 0 refills | Status: DC | PRN
Start: 2018-02-24 — End: 2018-02-24

## 2018-02-24 MED ORDER — LANSOPRAZOLE 30 MG PO CPDR
30 mg | ORAL_CAPSULE | Freq: Every day | ORAL | 3 refills | 45.00000 days | Status: AC
Start: 2018-02-24 — End: ?

## 2018-02-24 MED ORDER — LACTATED RINGERS IV SOLP
INTRAVENOUS | 0 refills | Status: DC
Start: 2018-02-24 — End: 2018-02-24
  Administered 2018-02-24: 13:00:00 1000.000 mL via INTRAVENOUS

## 2018-02-26 ENCOUNTER — Encounter: Admit: 2018-02-26 | Discharge: 2018-02-26

## 2018-02-26 DIAGNOSIS — I2510 Atherosclerotic heart disease of native coronary artery without angina pectoris: ICD-10-CM

## 2018-02-26 DIAGNOSIS — R011 Cardiac murmur, unspecified: Principal | ICD-10-CM

## 2018-02-26 DIAGNOSIS — IMO0002 Unspecified mental or behavioral problem: ICD-10-CM

## 2018-02-26 DIAGNOSIS — F4310 Post-traumatic stress disorder, unspecified: ICD-10-CM

## 2018-02-26 DIAGNOSIS — F909 Attention-deficit hyperactivity disorder, unspecified type: ICD-10-CM

## 2018-02-26 DIAGNOSIS — R6889 Other general symptoms and signs: ICD-10-CM

## 2018-02-26 DIAGNOSIS — A4902 Methicillin resistant Staphylococcus aureus infection, unspecified site: ICD-10-CM

## 2018-02-26 DIAGNOSIS — F419 Anxiety disorder, unspecified: ICD-10-CM

## 2018-02-26 DIAGNOSIS — R000 Tachycardia, unspecified: ICD-10-CM

## 2018-02-26 DIAGNOSIS — K929 Disease of digestive system, unspecified: ICD-10-CM

## 2018-02-26 DIAGNOSIS — F329 Major depressive disorder, single episode, unspecified: ICD-10-CM

## 2018-02-26 DIAGNOSIS — F603 Borderline personality disorder: ICD-10-CM

## 2018-02-26 DIAGNOSIS — R7303 Prediabetes: ICD-10-CM

## 2018-02-26 DIAGNOSIS — E282 Polycystic ovarian syndrome: ICD-10-CM

## 2018-03-31 ENCOUNTER — Ambulatory Visit: Admit: 2018-03-31 | Discharge: 2018-03-31

## 2018-03-31 ENCOUNTER — Ambulatory Visit: Admit: 2018-03-31 | Discharge: 2018-03-31 | Payer: MEDICAID

## 2018-03-31 ENCOUNTER — Encounter: Admit: 2018-03-31 | Discharge: 2018-03-31

## 2018-03-31 DIAGNOSIS — F909 Attention-deficit hyperactivity disorder, unspecified type: ICD-10-CM

## 2018-03-31 DIAGNOSIS — Z9884 Bariatric surgery status: ICD-10-CM

## 2018-03-31 DIAGNOSIS — R6889 Other general symptoms and signs: ICD-10-CM

## 2018-03-31 DIAGNOSIS — F4310 Post-traumatic stress disorder, unspecified: ICD-10-CM

## 2018-03-31 DIAGNOSIS — Z8614 Personal history of Methicillin resistant Staphylococcus aureus infection: ICD-10-CM

## 2018-03-31 DIAGNOSIS — K289 Gastrojejunal ulcer, unspecified as acute or chronic, without hemorrhage or perforation: ICD-10-CM

## 2018-03-31 DIAGNOSIS — F419 Anxiety disorder, unspecified: ICD-10-CM

## 2018-03-31 DIAGNOSIS — R112 Nausea with vomiting, unspecified: Principal | ICD-10-CM

## 2018-03-31 DIAGNOSIS — F329 Major depressive disorder, single episode, unspecified: ICD-10-CM

## 2018-03-31 DIAGNOSIS — I2510 Atherosclerotic heart disease of native coronary artery without angina pectoris: ICD-10-CM

## 2018-03-31 DIAGNOSIS — Z87891 Personal history of nicotine dependence: ICD-10-CM

## 2018-03-31 DIAGNOSIS — E282 Polycystic ovarian syndrome: ICD-10-CM

## 2018-03-31 DIAGNOSIS — R011 Cardiac murmur, unspecified: Principal | ICD-10-CM

## 2018-03-31 DIAGNOSIS — Z882 Allergy status to sulfonamides status: ICD-10-CM

## 2018-03-31 DIAGNOSIS — R1310 Dysphagia, unspecified: ICD-10-CM

## 2018-03-31 DIAGNOSIS — R7303 Prediabetes: ICD-10-CM

## 2018-03-31 DIAGNOSIS — Z886 Allergy status to analgesic agent status: ICD-10-CM

## 2018-03-31 DIAGNOSIS — F603 Borderline personality disorder: ICD-10-CM

## 2018-03-31 DIAGNOSIS — R000 Tachycardia, unspecified: ICD-10-CM

## 2018-03-31 DIAGNOSIS — IMO0002 Unspecified mental or behavioral problem: ICD-10-CM

## 2018-03-31 DIAGNOSIS — K929 Disease of digestive system, unspecified: ICD-10-CM

## 2018-03-31 DIAGNOSIS — A4902 Methicillin resistant Staphylococcus aureus infection, unspecified site: ICD-10-CM

## 2018-03-31 LAB — POC GLUCOSE: Lab: 96 mg/dL (ref 70–100)

## 2018-03-31 MED ORDER — PROPOFOL INJ 10 MG/ML IV VIAL
0 refills | Status: DC
Start: 2018-03-31 — End: 2018-03-31
  Administered 2018-03-31: 19:00:00 70 mg via INTRAVENOUS
  Administered 2018-03-31 (×4): 40 mg via INTRAVENOUS

## 2018-03-31 MED ORDER — SUCRALFATE 1 GRAM PO TAB
1 g | ORAL_TABLET | ORAL | 1 refills | Status: AC
Start: 2018-03-31 — End: ?

## 2018-03-31 MED ORDER — GLYCOPYRROLATE 0.2 MG/ML IJ SOLN
0 refills | Status: DC
Start: 2018-03-31 — End: 2018-03-31
  Administered 2018-03-31: 19:00:00 0.2 mg via INTRAVENOUS

## 2018-03-31 MED ORDER — LIDOCAINE (PF) 200 MG/10 ML (2 %) IJ SYRG
0 refills | Status: DC
Start: 2018-03-31 — End: 2018-03-31
  Administered 2018-03-31: 19:00:00 160 mg via INTRAVENOUS

## 2018-03-31 MED ORDER — FENTANYL CITRATE (PF) 50 MCG/ML IJ SOLN
25 ug | INTRAVENOUS | 0 refills | Status: DC | PRN
Start: 2018-03-31 — End: 2018-03-31

## 2018-03-31 MED ORDER — SODIUM CHLORIDE 0.9 % IV SOLP
INTRAVENOUS | 0 refills | Status: CN
Start: 2018-03-31 — End: ?

## 2018-03-31 MED ORDER — LIDOCAINE (PF) 10 MG/ML (1 %) IJ SOLN
.1-2 mL | Freq: Once | INTRAMUSCULAR | 0 refills | Status: CP
Start: 2018-03-31 — End: ?
  Administered 2018-03-31: 19:00:00 0.2 mL via INTRAMUSCULAR

## 2018-03-31 MED ORDER — ONDANSETRON HCL (PF) 4 MG/2 ML IJ SOLN
4 mg | Freq: Once | INTRAVENOUS | 0 refills | Status: DC | PRN
Start: 2018-03-31 — End: 2018-03-31

## 2018-03-31 MED ORDER — LANSOPRAZOLE 30 MG PO CPDR
30 mg | ORAL_CAPSULE | Freq: Every day | ORAL | 1 refills | 45.00000 days | Status: AC
Start: 2018-03-31 — End: 2018-06-20

## 2018-03-31 MED ORDER — LACTATED RINGERS IV SOLP
INTRAVENOUS | 0 refills | Status: DC
Start: 2018-03-31 — End: 2018-03-31
  Administered 2018-03-31: 19:00:00 1000.000 mL via INTRAVENOUS

## 2018-03-31 MED ORDER — PROPOFOL 10 MG/ML IV EMUL 20 ML (INFUSION)(AM)(OR)
INTRAVENOUS | 0 refills | Status: DC
Start: 2018-03-31 — End: 2018-03-31
  Administered 2018-03-31: 19:00:00 160 ug/kg/min via INTRAVENOUS
  Administered 2018-03-31: 19:00:00 20.000 mL via INTRAVENOUS

## 2018-04-01 ENCOUNTER — Encounter: Admit: 2018-04-01 | Discharge: 2018-04-01

## 2018-04-02 ENCOUNTER — Encounter: Admit: 2018-04-02 | Discharge: 2018-04-02

## 2018-04-02 DIAGNOSIS — F419 Anxiety disorder, unspecified: ICD-10-CM

## 2018-04-02 DIAGNOSIS — IMO0002 Unspecified mental or behavioral problem: ICD-10-CM

## 2018-04-02 DIAGNOSIS — F909 Attention-deficit hyperactivity disorder, unspecified type: ICD-10-CM

## 2018-04-02 DIAGNOSIS — E282 Polycystic ovarian syndrome: ICD-10-CM

## 2018-04-02 DIAGNOSIS — I2510 Atherosclerotic heart disease of native coronary artery without angina pectoris: ICD-10-CM

## 2018-04-02 DIAGNOSIS — R7303 Prediabetes: ICD-10-CM

## 2018-04-02 DIAGNOSIS — K929 Disease of digestive system, unspecified: ICD-10-CM

## 2018-04-02 DIAGNOSIS — F603 Borderline personality disorder: ICD-10-CM

## 2018-04-02 DIAGNOSIS — R011 Cardiac murmur, unspecified: Principal | ICD-10-CM

## 2018-04-02 DIAGNOSIS — R6889 Other general symptoms and signs: ICD-10-CM

## 2018-04-02 DIAGNOSIS — F4310 Post-traumatic stress disorder, unspecified: ICD-10-CM

## 2018-04-02 DIAGNOSIS — R000 Tachycardia, unspecified: ICD-10-CM

## 2018-04-02 DIAGNOSIS — A4902 Methicillin resistant Staphylococcus aureus infection, unspecified site: ICD-10-CM

## 2018-04-02 DIAGNOSIS — F329 Major depressive disorder, single episode, unspecified: ICD-10-CM

## 2018-04-15 ENCOUNTER — Ambulatory Visit: Admit: 2018-04-15 | Discharge: 2018-04-16 | Payer: MEDICAID

## 2018-04-22 ENCOUNTER — Ambulatory Visit: Admit: 2018-04-22 | Discharge: 2018-04-23 | Payer: MEDICAID

## 2018-04-22 ENCOUNTER — Encounter: Admit: 2018-04-22 | Discharge: 2018-04-22

## 2018-04-22 DIAGNOSIS — I2510 Atherosclerotic heart disease of native coronary artery without angina pectoris: ICD-10-CM

## 2018-04-22 DIAGNOSIS — IMO0002 Unspecified mental or behavioral problem: ICD-10-CM

## 2018-04-22 DIAGNOSIS — A4902 Methicillin resistant Staphylococcus aureus infection, unspecified site: ICD-10-CM

## 2018-04-22 DIAGNOSIS — F419 Anxiety disorder, unspecified: ICD-10-CM

## 2018-04-22 DIAGNOSIS — F4310 Post-traumatic stress disorder, unspecified: ICD-10-CM

## 2018-04-22 DIAGNOSIS — F909 Attention-deficit hyperactivity disorder, unspecified type: ICD-10-CM

## 2018-04-22 DIAGNOSIS — K929 Disease of digestive system, unspecified: ICD-10-CM

## 2018-04-22 DIAGNOSIS — R6889 Other general symptoms and signs: ICD-10-CM

## 2018-04-22 DIAGNOSIS — F603 Borderline personality disorder: ICD-10-CM

## 2018-04-22 DIAGNOSIS — R7303 Prediabetes: ICD-10-CM

## 2018-04-22 DIAGNOSIS — E282 Polycystic ovarian syndrome: ICD-10-CM

## 2018-04-22 DIAGNOSIS — R000 Tachycardia, unspecified: ICD-10-CM

## 2018-04-22 DIAGNOSIS — F329 Major depressive disorder, single episode, unspecified: ICD-10-CM

## 2018-04-22 DIAGNOSIS — R011 Cardiac murmur, unspecified: Principal | ICD-10-CM

## 2018-04-23 DIAGNOSIS — K289 Gastrojejunal ulcer, unspecified as acute or chronic, without hemorrhage or perforation: Principal | ICD-10-CM

## 2018-04-23 DIAGNOSIS — Z9884 Bariatric surgery status: ICD-10-CM

## 2018-04-28 ENCOUNTER — Encounter: Admit: 2018-04-28 | Discharge: 2018-04-28

## 2018-05-06 ENCOUNTER — Encounter: Admit: 2018-05-06 | Discharge: 2018-05-06

## 2018-05-06 ENCOUNTER — Ambulatory Visit: Admit: 2018-05-06 | Discharge: 2018-05-06 | Payer: MEDICAID

## 2018-05-06 DIAGNOSIS — Z9884 Bariatric surgery status: Principal | ICD-10-CM

## 2018-05-06 DIAGNOSIS — E559 Vitamin D deficiency, unspecified: ICD-10-CM

## 2018-05-06 LAB — IRON + BINDING CAPACITY + %SAT+ FERRITIN
Lab: 344 ug/dL (ref 270–380)
Lab: 64 ng/mL — ABNORMAL HIGH (ref 10–200)
Lab: 68 ug/dL (ref 50–160)

## 2018-05-06 LAB — VITAMIN B12: Lab: 215 pg/mL — ABNORMAL LOW (ref 180–914)

## 2018-05-06 LAB — COMPREHENSIVE METABOLIC PANEL
Lab: 141 MMOL/L (ref 137–147)
Lab: 3.9 MMOL/L (ref 3.5–5.1)
Lab: 4.1 g/dL (ref 3.5–5.0)
Lab: 6.9 g/dL (ref 6.0–8.0)
Lab: 89 U/L (ref <200)
Lab: >60 mL/min (ref >60)

## 2018-05-06 LAB — FOLATE, SERUM: Lab: 3.1 ng/mL — ABNORMAL LOW (ref >3.9)

## 2018-05-06 LAB — CBC
Lab: 4.2 M/UL (ref 4.0–5.0)
Lab: 7.8 10*3/uL (ref 4.5–11.0)

## 2018-05-06 LAB — LIPID PROFILE
Lab: 116 mg/dL — ABNORMAL HIGH (ref <100)
Lab: 125 mg/dL (ref >60)
Lab: 88 mg/dL (ref <150)

## 2018-05-06 LAB — PARATHYROID HORMONE: Lab: 43 pg/mL (ref 10–65)

## 2018-05-07 LAB — HEMOGLOBIN A1C: Lab: 5.2 % — ABNORMAL LOW (ref >40)

## 2018-05-07 LAB — 25-OH VITAMIN D (D2 + D3): Lab: 21 ng/mL — ABNORMAL LOW (ref 30–80)

## 2018-05-16 ENCOUNTER — Encounter: Admit: 2018-05-16 | Discharge: 2018-05-16

## 2018-05-16 DIAGNOSIS — E559 Vitamin D deficiency, unspecified: Principal | ICD-10-CM

## 2018-05-16 MED ORDER — ERGOCALCIFEROL (VITAMIN D2) 50,000 UNIT PO CAP
1 | ORAL_CAPSULE | ORAL | 0 refills | 56.00000 days | Status: AC
Start: 2018-05-16 — End: 2018-06-20

## 2018-06-03 ENCOUNTER — Encounter: Admit: 2018-06-03 | Discharge: 2018-06-03

## 2018-06-03 DIAGNOSIS — E860 Dehydration: Principal | ICD-10-CM

## 2018-06-03 MED ORDER — PROMETHAZINE 25 MG/ML IJ SOLN
25 mg | Freq: Once | INTRAVENOUS | 0 refills | Status: CN
Start: 2018-06-03 — End: ?

## 2018-06-03 MED ORDER — PRIOR AUTHORIZATION PLACEHOLDER (DO NOT RELEASE)
Freq: Once | 0 refills | Status: CN
Start: 2018-06-03 — End: ?

## 2018-06-03 MED ORDER — THIAMINE HCL (VITAMIN B1) 100 MG/ML IJ SOLN
100 mg | Freq: Once | INTRAVENOUS | 0 refills | Status: CN
Start: 2018-06-03 — End: ?

## 2018-06-03 MED ORDER — SODIUM CHLORIDE 0.9 % IV SOLP
1000 mL | Freq: Once | INTRAVENOUS | 0 refills | Status: CN
Start: 2018-06-03 — End: ?

## 2018-06-03 MED ORDER — SODIUM CHLORIDE 0.9 % IV SOLP
2000 mL | Freq: Once | INTRAVENOUS | 0 refills | Status: CN
Start: 2018-06-03 — End: ?

## 2018-06-04 ENCOUNTER — Encounter: Admit: 2018-06-04 | Discharge: 2018-06-04

## 2018-06-16 ENCOUNTER — Encounter: Admit: 2018-06-16 | Discharge: 2018-06-16

## 2018-06-20 ENCOUNTER — Ambulatory Visit: Admit: 2018-06-20 | Discharge: 2018-06-21 | Payer: MEDICAID

## 2018-06-20 ENCOUNTER — Encounter: Admit: 2018-06-20 | Discharge: 2018-06-20

## 2018-06-20 DIAGNOSIS — F603 Borderline personality disorder: ICD-10-CM

## 2018-06-20 DIAGNOSIS — R6889 Other general symptoms and signs: ICD-10-CM

## 2018-06-20 DIAGNOSIS — IMO0002 Unspecified mental or behavioral problem: ICD-10-CM

## 2018-06-20 DIAGNOSIS — R404 Transient alteration of awareness: Principal | ICD-10-CM

## 2018-06-20 DIAGNOSIS — E282 Polycystic ovarian syndrome: ICD-10-CM

## 2018-06-20 DIAGNOSIS — R011 Cardiac murmur, unspecified: Principal | ICD-10-CM

## 2018-06-20 DIAGNOSIS — K929 Disease of digestive system, unspecified: ICD-10-CM

## 2018-06-20 DIAGNOSIS — I2510 Atherosclerotic heart disease of native coronary artery without angina pectoris: ICD-10-CM

## 2018-06-20 DIAGNOSIS — F4310 Post-traumatic stress disorder, unspecified: ICD-10-CM

## 2018-06-20 DIAGNOSIS — R000 Tachycardia, unspecified: ICD-10-CM

## 2018-06-20 DIAGNOSIS — F909 Attention-deficit hyperactivity disorder, unspecified type: ICD-10-CM

## 2018-06-20 DIAGNOSIS — F419 Anxiety disorder, unspecified: ICD-10-CM

## 2018-06-20 DIAGNOSIS — F329 Major depressive disorder, single episode, unspecified: ICD-10-CM

## 2018-06-20 DIAGNOSIS — R7303 Prediabetes: ICD-10-CM

## 2018-06-20 DIAGNOSIS — G4719 Other hypersomnia: Secondary | ICD-10-CM

## 2018-06-20 DIAGNOSIS — A4902 Methicillin resistant Staphylococcus aureus infection, unspecified site: ICD-10-CM

## 2018-06-20 MED ORDER — NORTRIPTYLINE 25 MG PO CAP
25 mg | ORAL_CAPSULE | Freq: Every evening | ORAL | 5 refills | Status: AC
Start: 2018-06-20 — End: 2018-09-12

## 2018-06-21 DIAGNOSIS — R200 Anesthesia of skin: ICD-10-CM

## 2018-06-21 DIAGNOSIS — E538 Deficiency of other specified B group vitamins: Secondary | ICD-10-CM

## 2018-06-21 DIAGNOSIS — G43109 Migraine with aura, not intractable, without status migrainosus: ICD-10-CM

## 2018-07-02 ENCOUNTER — Encounter: Admit: 2018-07-02 | Discharge: 2018-07-02

## 2018-07-03 ENCOUNTER — Encounter: Admit: 2018-07-03 | Discharge: 2018-07-03

## 2018-07-18 ENCOUNTER — Encounter: Admit: 2018-07-18 | Discharge: 2018-07-18

## 2018-09-11 ENCOUNTER — Encounter: Admit: 2018-09-11 | Discharge: 2018-09-11

## 2018-09-12 ENCOUNTER — Encounter: Admit: 2018-09-12 | Discharge: 2018-09-12

## 2018-09-12 ENCOUNTER — Ambulatory Visit: Admit: 2018-09-12 | Discharge: 2018-09-13 | Payer: MEDICAID

## 2018-09-12 DIAGNOSIS — E282 Polycystic ovarian syndrome: ICD-10-CM

## 2018-09-12 DIAGNOSIS — F603 Borderline personality disorder: ICD-10-CM

## 2018-09-12 DIAGNOSIS — IMO0002 Unspecified mental or behavioral problem: ICD-10-CM

## 2018-09-12 DIAGNOSIS — I2510 Atherosclerotic heart disease of native coronary artery without angina pectoris: ICD-10-CM

## 2018-09-12 DIAGNOSIS — F329 Major depressive disorder, single episode, unspecified: ICD-10-CM

## 2018-09-12 DIAGNOSIS — K929 Disease of digestive system, unspecified: ICD-10-CM

## 2018-09-12 DIAGNOSIS — A4902 Methicillin resistant Staphylococcus aureus infection, unspecified site: ICD-10-CM

## 2018-09-12 DIAGNOSIS — F909 Attention-deficit hyperactivity disorder, unspecified type: ICD-10-CM

## 2018-09-12 DIAGNOSIS — R6889 Other general symptoms and signs: ICD-10-CM

## 2018-09-12 DIAGNOSIS — F4310 Post-traumatic stress disorder, unspecified: ICD-10-CM

## 2018-09-12 DIAGNOSIS — R011 Cardiac murmur, unspecified: Principal | ICD-10-CM

## 2018-09-12 DIAGNOSIS — F419 Anxiety disorder, unspecified: ICD-10-CM

## 2018-09-12 DIAGNOSIS — R7303 Prediabetes: ICD-10-CM

## 2018-09-12 DIAGNOSIS — R000 Tachycardia, unspecified: ICD-10-CM

## 2018-09-12 MED ORDER — PROPRANOLOL 80 MG PO CS24
80 mg | ORAL_CAPSULE | Freq: Two times a day (BID) | ORAL | 5 refills | Status: AC
Start: 2018-09-12 — End: 2019-07-30

## 2018-09-12 MED ORDER — PROPRANOLOL 20 MG PO TAB
ORAL_TABLET | Freq: Two times a day (BID) | 0 refills | Status: AC
Start: 2018-09-12 — End: 2019-07-30

## 2018-09-12 NOTE — Progress Notes
??? Number of children: Not on file   ??? Years of education: Not on file   ??? Highest education level: Not on file   Occupational History   ??? Not on file   Tobacco Use   ??? Smoking status: Former Smoker     Packs/day: 1.50     Years: 6.00     Pack years: 9.00     Types: Cigarettes     Last attempt to quit: 05/07/2017     Years since quitting: 1.3   ??? Smokeless tobacco: Never Used   Substance and Sexual Activity   ??? Alcohol use: No   ??? Drug use: No   ??? Sexual activity: Not on file   Other Topics Concern   ??? Not on file   Social History Narrative   ??? Not on file          Allergies:  Mushroom; Asa [aspirin]; Coconut; Nsaids (non-steroidal anti-inflammatory drug); Sulfa (sulfonamide antibiotics); and Yellow dye             Review of Systems   Eyes: Positive for photophobia.   Musculoskeletal: Positive for back pain and myalgias.   Allergic/Immunologic: Positive for food allergies.   All other systems reviewed and are negative.        Objective:        Meds:  ??? ABILIFY MAINTENA 400 mg injection SYRINGE Inject 400 mg into the muscle every 28 days.   ??? nortriptyline (PAMELOR) 25 mg capsule Take one capsule by mouth at bedtime daily.     Vitals:    09/12/18 1529   BP: 123/77   BP Source: Arm, Right Upper   Patient Position: Sitting   Pulse: 82   Weight: 93.4 kg (206 lb)   Height: 167.6 cm (66)   PainSc: Six     Body mass index is 33.25 kg/m???.     Physical Exam  General physical exam:    HEENT: normocephalic, eyes open with no discharge, nares patent, oropharynx is clear with no lesions, palate intact  CV: regular rate and rhythm  Chest: no increased work of breathing  Ext: no swelling  Skin: no rashes or lesions    Neuro exam:   Mental status: alert, oriented to person/place/time.  She provides detailed hx and recalls both remote and recent events without problem.  She is attentive.  Speech:   ??? Normal Abnormal   Fluency x ???   Comprehension x ???   Articulation x ???   Repetition x ???   Naming x ???   ???    Cranial Nerves: ??? Normal Abnormal   II Fundoscopic exam with normal optic discs, Pupils reactive, visual fields normal ???   III, IV, VI EOMI, no nystagmus ???   V Sensation nml V1-V3 ???   VII Strong eye closure, symmetric facial movements ???   VIII nml to finger rub ???   IX, X +strong cough ???   XI Equal shoulder shrug ???   XII Tongue midline ???   ???    Muscle/motor:   Tone: nml  Bulk: nml  Fasciculations: none  ???  ???  ??? NF NE SA EF EE WE WF FF FE FA TA HF HA HE KF KE DF PF In Ev TF TE   R ??? ??? 5 5 5 5 5 5 5  ??? ??? 5 ??? ??? 5 5 5 5  ??? ??? ??? ???   L ??? ??? 5 5 5 5 5 5 5  ??? ???  5 ??? ??? 5 5 5 5  ??? ??? ??? ???   ???    ???  Cerebellar Function:               Finger-to-nose: normal               Heel-to-shin: normal  ???  ???  Sensation:               Light touch: intact               temperature: normal    ???  ???  Reflexes:               DTR's                 R/L               Biceps                2/2               Triceps               2/2               Brachioradialis   2/2               Quadriceps        2/2               Achilles              2/2  ???   Pathologic reflexes:               Plantar response: toes downgoing bilaterally  ???      Gait and station:               Gait: Normal length of gait with appropriate arm swing.  No decomposition of turn. No ataxia.   ???  ???  Laboratory Review:   ???  05/06/18 B12 215, folate 3.1, A1c 5.2  ???  ???  Assessment/Plan  [name] is a 33 y.o. R handed female who presents as a new patient for complaints of staring spells, blackouts, and headache.  ???  ???  Neuro exam reveals circumferential pinprick loss below the knee on the left as well as decreased vibration from the ankle distally.  ???  Diagnostic Imaging/Labs:  -05/06/18 B12 215, folate 3.1, A1c 5.2  ???  Impression:   1. Staring spells - Suspect this is secondary to excessive daytime sleepiness causing poor attention.  B12 and folate could be contributing.  She has a significant family hx of epilepsy though seizure is unlikely

## 2018-09-13 DIAGNOSIS — G4719 Other hypersomnia: ICD-10-CM

## 2018-09-13 DIAGNOSIS — E538 Deficiency of other specified B group vitamins: ICD-10-CM

## 2018-09-13 DIAGNOSIS — R404 Transient alteration of awareness: Secondary | ICD-10-CM

## 2018-09-13 DIAGNOSIS — G43109 Migraine with aura, not intractable, without status migrainosus: Principal | ICD-10-CM

## 2018-09-17 ENCOUNTER — Encounter: Admit: 2018-09-17 | Discharge: 2018-09-17

## 2018-09-17 MED ORDER — PROPRANOLOL 20 MG PO TAB
ORAL_TABLET | Freq: Two times a day (BID) | 0 refills
Start: 2018-09-17 — End: ?

## 2018-10-06 ENCOUNTER — Encounter: Admit: 2018-10-06 | Discharge: 2018-10-06

## 2018-10-06 NOTE — Telephone Encounter
PA for Propranolol 80mg  processed via cover my meds through [facility]  PA case # [id number]

## 2018-10-15 ENCOUNTER — Encounter: Admit: 2018-10-15 | Discharge: 2018-10-15

## 2018-10-17 ENCOUNTER — Encounter: Admit: 2018-10-17 | Discharge: 2018-10-17

## 2018-10-21 ENCOUNTER — Encounter: Admit: 2018-10-21 | Discharge: 2018-10-21

## 2018-10-27 ENCOUNTER — Encounter: Admit: 2018-10-27 | Discharge: 2018-10-27

## 2018-10-28 ENCOUNTER — Encounter: Admit: 2018-10-28 | Discharge: 2018-10-28

## 2018-10-28 ENCOUNTER — Ambulatory Visit: Admit: 2018-10-28 | Discharge: 2018-10-28 | Payer: MEDICAID

## 2018-10-28 DIAGNOSIS — I2510 Atherosclerotic heart disease of native coronary artery without angina pectoris: ICD-10-CM

## 2018-10-28 DIAGNOSIS — K929 Disease of digestive system, unspecified: ICD-10-CM

## 2018-10-28 DIAGNOSIS — R6889 Other general symptoms and signs: ICD-10-CM

## 2018-10-28 DIAGNOSIS — IMO0002 Unspecified mental or behavioral problem: ICD-10-CM

## 2018-10-28 DIAGNOSIS — F909 Attention-deficit hyperactivity disorder, unspecified type: ICD-10-CM

## 2018-10-28 DIAGNOSIS — R011 Cardiac murmur, unspecified: Principal | ICD-10-CM

## 2018-10-28 DIAGNOSIS — A4902 Methicillin resistant Staphylococcus aureus infection, unspecified site: ICD-10-CM

## 2018-10-28 DIAGNOSIS — R7303 Prediabetes: ICD-10-CM

## 2018-10-28 DIAGNOSIS — F329 Major depressive disorder, single episode, unspecified: ICD-10-CM

## 2018-10-28 DIAGNOSIS — E282 Polycystic ovarian syndrome: ICD-10-CM

## 2018-10-28 DIAGNOSIS — F419 Anxiety disorder, unspecified: ICD-10-CM

## 2018-10-28 DIAGNOSIS — R000 Tachycardia, unspecified: ICD-10-CM

## 2018-10-28 DIAGNOSIS — Z9884 Bariatric surgery status: Principal | ICD-10-CM

## 2018-10-28 DIAGNOSIS — F4310 Post-traumatic stress disorder, unspecified: ICD-10-CM

## 2018-10-28 DIAGNOSIS — F603 Borderline personality disorder: ICD-10-CM

## 2018-12-16 ENCOUNTER — Encounter: Admit: 2018-12-16 | Discharge: 2018-12-16

## 2018-12-16 NOTE — Telephone Encounter
Message left for pt.  Ready to schedule 1 yr fuv.

## 2019-01-06 ENCOUNTER — Ambulatory Visit: Admit: 2019-01-06 | Discharge: 2019-01-07

## 2019-01-06 ENCOUNTER — Encounter: Admit: 2019-01-06 | Discharge: 2019-01-06

## 2019-01-06 DIAGNOSIS — G4719 Other hypersomnia: Secondary | ICD-10-CM

## 2019-01-06 DIAGNOSIS — R7303 Prediabetes: Secondary | ICD-10-CM

## 2019-01-06 DIAGNOSIS — R011 Cardiac murmur, unspecified: Secondary | ICD-10-CM

## 2019-01-06 DIAGNOSIS — F329 Major depressive disorder, single episode, unspecified: Secondary | ICD-10-CM

## 2019-01-06 DIAGNOSIS — F902 Attention-deficit hyperactivity disorder, combined type: Secondary | ICD-10-CM

## 2019-01-06 DIAGNOSIS — F419 Anxiety disorder, unspecified: Secondary | ICD-10-CM

## 2019-01-06 DIAGNOSIS — F909 Attention-deficit hyperactivity disorder, unspecified type: Secondary | ICD-10-CM

## 2019-01-06 DIAGNOSIS — IMO0002 Unspecified mental or behavioral problem: Secondary | ICD-10-CM

## 2019-01-06 DIAGNOSIS — E282 Polycystic ovarian syndrome: Secondary | ICD-10-CM

## 2019-01-06 DIAGNOSIS — K929 Disease of digestive system, unspecified: Secondary | ICD-10-CM

## 2019-01-06 DIAGNOSIS — I2510 Atherosclerotic heart disease of native coronary artery without angina pectoris: Secondary | ICD-10-CM

## 2019-01-06 DIAGNOSIS — F603 Borderline personality disorder: Secondary | ICD-10-CM

## 2019-01-06 DIAGNOSIS — R000 Tachycardia, unspecified: Secondary | ICD-10-CM

## 2019-01-06 DIAGNOSIS — A4902 Methicillin resistant Staphylococcus aureus infection, unspecified site: Secondary | ICD-10-CM

## 2019-01-06 DIAGNOSIS — R6889 Other general symptoms and signs: Secondary | ICD-10-CM

## 2019-01-06 DIAGNOSIS — G40909 Epilepsy, unspecified, not intractable, without status epilepticus: Secondary | ICD-10-CM

## 2019-01-06 DIAGNOSIS — F4310 Post-traumatic stress disorder, unspecified: Secondary | ICD-10-CM

## 2019-01-06 NOTE — Assessment & Plan Note
She currently is on Abilify Maintena, monthly injection, and is doing wonderfully from mood perspective.   I would not add any ADHD meds at this time.

## 2019-01-06 NOTE — Progress Notes
Date of Service: 01/06/2019    Subjective:             [name] is a 33 y.o. female.    History of Present Illness    Obtained patient's, or patient proxy's, verbal consent to treat them and their agreement to [facility] financial policy and NPP via this telehealth visit during the [facility]  Phone: 50 min, she had to leave due to class starting at 9 am.    Having seizures at one point in time.  Hasn't had one since December or January.   A typical spell:  Sitting on the floor and sitting on floor against the couch and talking to roommate.   Spaced out and went blank.  Did not fall over.  Stayed sitting up.  I could hear everything going on still.    She was spaced out and couldn't function.  Once came to was able to talk.   It lasts maybe a couple of minutes.    Roommate was with her and was spaced out.  Zoning out.  She was trying to get her attention and wouldn't respond.  She snapped her fingers a few times.    Had a total of 2-3 spells.   No common treat.  She felt weak like she was sitting there like a dead weight.    Felt sluggish for a minute or two, then wondering what happened.    No memory of events after the hours.    She feels sleepy all day long.  Feels like hasn't slept in days.  BT between 8-10 pm  SOL: 60-90 minutes  RLS - states constantly tossing and turning until fall asleep.  Once falls asleep, she will finally fall asleep then toss and turns all night long.    Feels sore at night.   Wake: 8 am.    Feels like gets 5-6 hours.  EDS - doesn't nap.  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  By the time done working, 3 pm, feels done in.    Awakenings: hard time staying asleep.  Once goes to sleep then wakes up at 4 am thinking overslept then can't go back to sleep.   ADHD?  Yes treated with stimulants.  Age 31 yo until 6-15.  Medication helped her. She was on all sorts of medications.  Ritalin to Adderall   (Adderall chewed her fingernails bloody raw and was on edge).  Ritalin up and down all over the place.   Strattera was on that and saw a complete difference.  On Abilify Maintena which has made her feel normal.    Sleep study this year    SP: no  HH: no  Cataplexy: when get angry gets muscle weakness.  For example if dad upsets her and gets angry, body feels like weak all of a sudden.  Heavy weight.  Shaky.  Lasts 15-20 minutes.  Can speak clearly sometimes yes and sometimes no. Can maintain standing.     FH:  Mom has sleep apnea, brother OSA passed away 3 years ago due to heart condition 32 yo   Has costochondritis.  Adopted mom has pulmonary HTN.  Biological mom had CHF passed away 8 years ago so was 32 yo.         Review of Systems   Constitutional: Positive for fatigue.   Neurological: Positive for dizziness,  light-headedness and headaches.   Psychiatric/Behavioral: Positive for sleep disturbance.   All other systems reviewed and are negative.        Objective:         ??? ABILIFY MAINTENA 400 mg injection SYRINGE Inject 400 mg into the muscle every 28 days.   ??? cholecalciferol (VITAMIN D-3) 1,000 units tablet Take 1,000 Units by mouth daily.   ??? cyanocobalamin (RUBRAMIN) 1,000 mcg/mL injection Inject 1 mL into the muscle every 30 days.   ??? other medication Take 1 Dose by mouth once. Bariatric chewable.   ??? propranoloL (INDERAL) 20 mg tablet 1 tablet twice daily for 3 days, then 1 tablet three times daily for 3 days, then start long acting   ??? propranolol LA (INDERAL LA) 80 mg capsule Take one capsule by mouth twice daily.     Vitals:    01/06/19 0811   Weight: 88 kg (194 lb)   Height: 167.6 cm (66)   PainSc: Zero     Body mass index is 31.31 kg/m???.     Physical Exam         Assessment and Plan:  **The patient's Epworth Sleepiness Scale Score is 4/24.  STOPBANG Score: 3  If score > 3 there is a high probability that they have OSA. Depression Screening was performed on [name] in clinic today. Based on the score of 0, no follow up action or recommendations are necessary at this time.  Discussed patient's BMI with her.  The body mass index is 31.31 kg/m???. and falls within the category of Obesity 1 (30  to <35); specialist visit only, referred back to Primary Care Provider for follow up.    Problem   Attention Deficit Hyperactivity Disorder (Adhd), Combined Type    Diagnosed age 63 yo, Ritalin up and down all over the place Adderall - chewed fingernails bloody raw  Strattera had good response     Excessive Daytime Sleepiness    Denies SP/HH/defintive cataplexy  (possible cataplexy?  Anger causes her body to feel heavy and shaky, sometimes trouble talking)          Excessive daytime sleepiness  PSG - likely will not be covered by [facility].  She has had normal HST but continues to wake up throughout the night.  She has had OSA ruled out with HST but she needs in lab PSG to assess for seizures (has daytime staring spells), alpha-delta pattern and PLMD.      MSLT could be considered as well in the future.    The staring spells she had (last Dec/Jan) do not sound like cataplexy as it was not precipitated by emotion and she had hours of amnesia after the event.  She is being assessed for epilepsy with EEG.  MRI done at [facility] [place] and will try to retrieve that MRI.        Attention deficit hyperactivity disorder (ADHD), combined type  She currently is on Abilify Maintena, monthly injection, and is doing wonderfully from mood perspective.   I would not add any ADHD meds at this time.

## 2019-01-07 ENCOUNTER — Encounter: Admit: 2019-01-07 | Discharge: 2019-01-07

## 2019-01-07 NOTE — Telephone Encounter
Phone call to pt who will get copy of MRI results and fax to office.

## 2019-01-14 ENCOUNTER — Encounter: Admit: 2019-01-14 | Discharge: 2019-01-14

## 2019-01-14 NOTE — Telephone Encounter
-----   Message from Donella Stade sent at 10/29/2018 11:51 AM CDT -----  Regarding: lab orders faxed to lab  [facility]

## 2019-01-22 ENCOUNTER — Encounter: Admit: 2019-01-22 | Discharge: 2019-01-22

## 2019-01-22 DIAGNOSIS — Z1159 Encounter for screening for other viral diseases: Secondary | ICD-10-CM

## 2019-01-29 ENCOUNTER — Encounter: Admit: 2019-01-29 | Discharge: 2019-01-29

## 2019-01-30 ENCOUNTER — Ambulatory Visit: Admit: 2019-01-30 | Discharge: 2019-01-30

## 2019-01-30 ENCOUNTER — Encounter: Admit: 2019-01-30 | Discharge: 2019-01-30

## 2019-01-30 DIAGNOSIS — A4902 Methicillin resistant Staphylococcus aureus infection, unspecified site: Secondary | ICD-10-CM

## 2019-01-30 DIAGNOSIS — K929 Disease of digestive system, unspecified: Secondary | ICD-10-CM

## 2019-01-30 DIAGNOSIS — I2510 Atherosclerotic heart disease of native coronary artery without angina pectoris: Secondary | ICD-10-CM

## 2019-01-30 DIAGNOSIS — F4310 Post-traumatic stress disorder, unspecified: Secondary | ICD-10-CM

## 2019-01-30 DIAGNOSIS — R000 Tachycardia, unspecified: Secondary | ICD-10-CM

## 2019-01-30 DIAGNOSIS — Z9884 Bariatric surgery status: Principal | ICD-10-CM

## 2019-01-30 DIAGNOSIS — R6889 Other general symptoms and signs: Secondary | ICD-10-CM

## 2019-01-30 DIAGNOSIS — F603 Borderline personality disorder: Secondary | ICD-10-CM

## 2019-01-30 DIAGNOSIS — R011 Cardiac murmur, unspecified: Secondary | ICD-10-CM

## 2019-01-30 DIAGNOSIS — R7303 Prediabetes: Secondary | ICD-10-CM

## 2019-01-30 DIAGNOSIS — F419 Anxiety disorder, unspecified: Secondary | ICD-10-CM

## 2019-01-30 DIAGNOSIS — IMO0002 Unspecified mental or behavioral problem: Secondary | ICD-10-CM

## 2019-01-30 DIAGNOSIS — E282 Polycystic ovarian syndrome: Secondary | ICD-10-CM

## 2019-01-30 DIAGNOSIS — F329 Major depressive disorder, single episode, unspecified: Secondary | ICD-10-CM

## 2019-01-30 DIAGNOSIS — F909 Attention-deficit hyperactivity disorder, unspecified type: Secondary | ICD-10-CM

## 2019-01-30 LAB — CBC AND DIFF
Lab: 0 % — ABNORMAL LOW (ref 0–2)
Lab: 0 10*3/uL (ref 0–0.20)
Lab: 0 K/UL (ref >60)
Lab: 1 % (ref 0–5)
Lab: 13 % (ref 11–15)
Lab: 2.1 K/UL (ref 1.0–4.8)
Lab: 39 % (ref 36–45)
Lab: 4 M/UL (ref 4.0–5.0)
Lab: 6.2 10*3/uL (ref 4.5–11.0)

## 2019-01-30 LAB — FOLATE, SERUM: Lab: 4.3 ng/mL (ref >3.9)

## 2019-01-30 LAB — COMPREHENSIVE METABOLIC PANEL
Lab: 140 MMOL/L (ref 137–147)
Lab: 18 U/L (ref 7–56)
Lab: 180 U/L — ABNORMAL HIGH (ref 25–110)
Lab: 89 mg/dL (ref 70–100)
Lab: >60 mL/min (ref >60)

## 2019-01-30 LAB — VITAMIN B12: Lab: 174 pg/mL — ABNORMAL LOW (ref 180–914)

## 2019-01-30 LAB — IRON + BINDING CAPACITY + %SAT+ FERRITIN
Lab: 117 ug/dL (ref 50–160)
Lab: 16 ng/mL (ref 10–200)
Lab: 374 ug/dL (ref 270–380)

## 2019-01-30 NOTE — Progress Notes
CC: Morbid obesity, status post  L-RYGB seen today for 1 year follow up appointment.    Subjective:   [name] is a 33 y.o. status post above.  Patient states she feels great.  Is thrilled with her results.  Denies any ongoing dysphagia.    Patient reports still struggles with eating too quickly.    Patient has a difficult time providing diet history.    Will often eat at [place] buffet and states will eat a lot of protein.  Will go to [employer] as well.    Will focus on salads or protein-based.    Patient continues to take monthly B12.  She has bariatric MVI but forgets to take this.  Will start to set an alarm in the morning to take.    Exercise:  Patient uses a fitness walking tracker.  Tries to hit daily goal of 4000 steps daily.    Goal weight 150-160lbs.       PE:   Vitals:    01/30/19 1438   BP: 107/52   Pulse: 69   SpO2: 100%   Weight: 86 kg (189 lb 8 oz)   Height: 167.6 cm (66)   PainSc: Zero       Body mass index is 30.59 kg/m???.    Bariatric Surgery  01/30/2019 10/28/2018 04/22/2018 02/21/2018 01/31/2018 01/07/2018 07/30/2017   Type of Surgery Roux-en-y Gastric Bypass Roux-en-y Gastric Bypass Roux-en-y Gastric Bypass Roux-en-y Gastric Bypass Roux-en-y Gastric Bypass Roux-en-y Gastric Bypass Roux-en-y Gastric Bypass     Visit Details  01/30/2019 10/28/2018 04/22/2018 02/21/2018 01/31/2018 01/07/2018 07/30/2017   Surgery date 01/20/2018 01/20/2018 01/20/2018 01/20/2018 02/10/2018 - -   Type of Visit Post-Op Post-Op Post-Op Post-Op Post-Op Pre-Op Initial Consult   Have you received a flu vaccine this season? Yes - - - - - -     Measurements  01/30/2019 10/28/2018 04/22/2018 02/21/2018 01/31/2018 01/07/2018 07/30/2017   BMI 30.5 32.2 39.8 44.3 46.9 49.1 48.2   Weight 189 lb 8 oz 200 lb 246 lb 8 oz 274 lb 9.6 oz 290 lb 8 oz 305 lb 6.4 oz 298 lb 9.6 oz   Right Upper Arm Circumference (Inches) - - 19 - - 20.5 15.5   Waist Circumference (Inches) - - 44.5 - - 55 49   Hip Circumference (Inches) - - 54 - - 60.25 50 Right Thigh Circumference (Inches) - - 27.75 - - 29 25     No flowsheet data found.        Abdomen - soft, nondistended, incision sites c/d/i, no evidence of hernia    Assessment/Plan:  33 y.o. female s/p L-RYGB with current Body mass index is 30.59 kg/m???.  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 Velva Harman)  Baseline BMI:  49.29 kg/m???.   Pre-op obesity-related comorbidities:   PCOS and GERD (resolved 10/2018), risk factors for OSA- did not require CPAP therapy.    Vitamins/Supplements: Bariatric melt/chewable daily, Vit b12 injections monthly, Vit D 1000mg  daily.    Associated conditions:  Marginal ulcer (resolved 4/20).               1 year labs:  Drawn today but not yet resulted.  We discussed importance of compliance with vitamin regimen as patient states she forgets frequently.  She will start setting an alarm to take.    We also discussed the importance of increasing exercise regimen and adding in resistance training to maintain  bone health and underlying lean protein muscle mass.    I have offered her a referral to our bariatric clinic psychologist to work on issues with mindfulness with eating.  She defers at this time and will call if wishes to pursue this in the future.    Counseled the patient regarding mindfulness with eating.  Discussed allowing at least 20 minutes for meals, chewing each bite thoroughly, and avoiding liquids with meals.  We discussed the importance of maintaining adequate protein intake.  We discussed adequate cardiovascular exercise and strength training.  Discussed recommendations for weekly exercise for weight maintenance and overall health.      Bufford Lope, MD                                                                            01/30/19         Total time 25 minutes.  Estimated counseling time 15 minutes.  Counseled patient as delineated above.    Bariatric Follow-Up Bariatric Review Flowsheet Brief 01/30/2019 10/28/2018 04/22/2018 02/21/2018 01/31/2018 01/07/2018 07/30/2017   Type of Visit Post-Op Post-Op Post-Op Post-Op Post-Op Pre-Op Initial Consult   BMI 30.5 32.2 39.8 44.3 46.9 49.1 48.2   Weight 189 lb 8 oz 200 lb 246 lb 8 oz 274 lb 9.6 oz 290 lb 8 oz 305 lb 6.4 oz 298 lb 9.6 oz       Comorbidity  Sleep apnea? No     Hyperlipidemia requiring medications: No     GERD requiring medications: No     Hypertension requiring medications: Yes     Diabetes: No   insulin dependent:  n/a    Current medications:  Current Outpatient Medications on File Prior to Visit   Medication Sig Dispense Refill   ??? ABILIFY MAINTENA 400 mg injection SYRINGE Inject 400 mg into the muscle every 28 days.     ??? acetaminophen/codeine (TYLENOL #3) 300/30 mg tablet      ??? cholecalciferol (VITAMIN D-3) 1,000 units tablet Take 1,000 Units by mouth daily.     ??? cyanocobalamin (RUBRAMIN) 1,000 mcg/mL injection Inject 1 mL into the muscle every 30 days.     ??? diclofenac (VOLTAREN) 1 % topical gel Apply 2 g topically to affected area four times daily as needed.     ??? famotidine (PEPCID) 20 mg tablet TK 1 T PO BID FOR GASTRITIS     ??? methocarbamoL (ROBAXIN) 500 mg tablet Take 500 mg by mouth every 6 hours as needed.     ??? ondansetron (ZOFRAN ODT) 8 mg rapid dissolve tablet TK 1 T PO QID PRF ACTIVE VOMITING     ??? other medication Take 1 Dose by mouth once. Bariatric chewable.     ??? propranoloL (INDERAL) 20 mg tablet 1 tablet twice daily for 3 days, then 1 tablet three times daily for 3 days, then start long acting 15 tablet 0   ??? propranolol LA (INDERAL LA) 80 mg capsule Take one capsule by mouth twice daily. 60 capsule 5     No current facility-administered medications on file prior to visit.

## 2019-02-01 ENCOUNTER — Encounter: Admit: 2019-02-01 | Discharge: 2019-02-02

## 2019-02-01 DIAGNOSIS — Z01818 Encounter for other preprocedural examination: Secondary | ICD-10-CM

## 2019-02-01 NOTE — Progress Notes
Patient arrived to [facility] for COVID-19 testing 02/01/19 7:45am. Patient identity confirmed via photo I.D. Nasopharyngeal procedure explained to the patient.   Nasopharyngeal swab completed right  Patient education provided given and instructed patient self isolate until contacted w/ results and further instructions.   Swab collected by Baldemar Lenis, RN.    Date symptoms began/reason for testing: Pre-Op 02/03/19

## 2019-02-02 ENCOUNTER — Encounter: Admit: 2019-02-02 | Discharge: 2019-02-02

## 2019-02-02 DIAGNOSIS — Z1159 Encounter for screening for other viral diseases: Secondary | ICD-10-CM

## 2019-02-02 LAB — COVID-19 (SARS-COV-2) PCR

## 2019-02-03 ENCOUNTER — Ambulatory Visit: Admit: 2019-02-04 | Discharge: 2019-02-04

## 2019-02-03 DIAGNOSIS — G4733 Obstructive sleep apnea (adult) (pediatric): Secondary | ICD-10-CM

## 2019-02-03 LAB — VITAMIN A: Lab: 20 % — ABNORMAL LOW (ref 28–42)

## 2019-02-04 ENCOUNTER — Encounter: Admit: 2019-02-04 | Discharge: 2019-02-04

## 2019-02-04 DIAGNOSIS — E538 Deficiency of other specified B group vitamins: Secondary | ICD-10-CM

## 2019-02-04 DIAGNOSIS — E559 Vitamin D deficiency, unspecified: Secondary | ICD-10-CM

## 2019-02-04 DIAGNOSIS — E509 Vitamin A deficiency, unspecified: Secondary | ICD-10-CM

## 2019-02-04 MED ORDER — VITAMIN A 10,000 UNIT PO CAP
10000 [IU] | ORAL_CAPSULE | Freq: Every day | ORAL | 0 refills | Status: AC
Start: 2019-02-04 — End: ?

## 2019-02-04 MED ORDER — ERGOCALCIFEROL (VITAMIN D2) 1,250 MCG (50,000 UNIT) PO CAP
1 | ORAL_CAPSULE | ORAL | 0 refills | 56.00000 days | Status: AC
Start: 2019-02-04 — End: ?

## 2019-02-04 NOTE — Telephone Encounter
I spoke to patient regarding lab results. Vit D level is at a low level requiring treatment. A prescription was sent to the patient's pharmacy. Begin taking Vit D2 50,000 IU 2 weekly for 8 weeks. After this therapy is complete, change to Vit D3 5000 IU daily. We have placed orders to repeat a Vit D level once the therapy is completed. Will also begin Vitamin A repletion of 10,000IU daily for 2 weeks, then recheck in 2 months. B12 level is also in low range, patient advised to begin OTC sublingual B12 daily. Patient in agreement with plan and all questions answered.

## 2019-02-04 NOTE — Progress Notes
EVENING QUESTIONNAIRE    Personal interpretation of sleep problem: frequently waking up several times during the night not knowing it.    Length of problem: as long as I can remember    Previously treated: No   How:    Previously Sleep Study: Yes   Where: [facility] sleep test   When: unknown  Weekday Bedtime: 9-10p    Weekday Wake Time: 7-9a    Weekend Bedtime: 11p-12a    Weekend Wake Time: 9a-10a    Typical amount of time it takes to fall asleep: 1-2.5 hours or longer    Times woken during the night: 5   Reason: I Don't Know    If applicable, hours worked: None    Sleep Issues:  Difficulty sleeping, Difficulty staying awake, Takes naps during the day, Waking up too early, Cannot get back to sleep if woken up at night, Sweat during sleep, Nasal congestion, Dry mouth, Morning headaches, Feels tired after waking, Daytime sleepiness, Daytime fatigue, Difficulty concentrating, Irritable, Forgetful, Crawling or aching feeling while falling asleep, Leg twitches during sleep, Upon waking or going to sleep, have feeling of paralysis or unable to move, Teeth grinding, Night time snacking, Bad dreams and Family history of sleep problems: mother and oldest brother      Level of Tiredness:    1 Sitting and reading   1 Watching TV   0 SItting inactive in a public place   2 Passenger in car for an hour without a break   2 Lying down to rest in the afternoon when circumstances permit   0 Sitting and talking to someone   0 Sitting quietly after a lunch without alcohol   0 In car while stopped for a few minutes in traffic     MORNING QUESTIONNAIRE    Sleep Lab Questionnaire    Total minutes it took to fall asleep: 30-60 mins    Length of Sleep: not sure hour(s)    Sleep compared to a typical night was same    Factors that influenced sleep quality: noise and technicians were talking    Sleep quality N/a with use of CPAP.

## 2019-02-04 NOTE — Procedures
[facility]  Department of Neurology Sleep Medicine Clinic  [address] [place], 3rd Floor, [address]  [place], [place] [address]  Sleep Lab: [phone]  Fax: [phone]  Nurse number: [phone]    BASELINE POLYSOMNOGRAPHY      Name: [name], [name]  MR#: [id number]  Study Date: 02/03/2019  DOB: [birth year]  Gender: F    Recording montage  The patient underwent a routine all night polysomnography recording the following channels: 6 EEG, 2 eye, 2 EMG (1 submental and 1 references for bilateral legs), airflow (thermocouple/pneumotach), one lead EKG, 1 thoracic motion, 1 abdominal motion, microphone for snoring, and pulse oximetry.     Polysomnographic findings  Total Time in Bed (TIB) was 454.0, total sleep time (TST) was 410.0 for a sleep efficiency of 90.3%. Wake after sleep onset was 24.5. Sleep onset latency was 19.5 minutes. REM latency from sleep onset was 60.5 minutes. Sleep architecture showed stage N1 sleep (8.5 minutes) 2.1%, stage N2 sleep (262.5 minutes) 64.0%, stage N3/slow wave sleep (57.5 minutes) 14.0%, and stage REM sleep (81.5 minutes) 19.9%. The arousal index was 6.3 events per hour.    Respiratory measures  Respiratory monitoring showed snoring. The overall AHI using 3% criteria is 0.7, due to 0 obstructive apneas, 1 hypopneas, 4 central apneas, 0 mixed apneas.  AHI using 4% criteria was 0.7 events per hour. The REM AHI was 0.0, supine REM AHI 0.0 and lateral REM AHI was 0.00. The overall supine AHI was using 3% criteria was 1.0 and lateral AHI was 0.00.     No Periodic breathing was observed. Oxyhemoglobin saturation mean was 96% and reached a nadir of 90% due to respiratory events. Time below 88% was 0.0 minutes.    EKG  EKG showed sinus rhythm throughout with an average heart rate during sleep of 67.7. The highest pulse rate was 92.  The following arrhythmias were observed:  none    Periodic Limb Movements  The periodic limb movement index was 0 per hour. Electroencephalography  With the 6 lead EEG, no epileptiform activity was seen. Spindles and k-complexes were well-formed and symmetrical.  Alpha intrusion was seen on the sleep EEG.    Impression:  1.  This overnight polysomnography shows no sleep apnea or PLMD.  2.  Sleep architecture showed a short REM latency at 60 minutes.    Recommendation:  A short REM latency can be seen with multiple things such as withdrawal from REM suppressing medications, sleep deprivation leading up to the study, depression or narcolepsy.  If deemed appropriate, assessment by a sleep clinician would be warranted.         Lights Off:  ??? Time: 2124  ??? Epoch: 10    Lights On:  ??? Time: 0458  ??? Epoch: 918  Arrhthymias:  No       Type: N/a    Epochs: N/a    PAP Final Pressure TX: N/a    C-Flex: N/a    If CPAP not tolerated, reason: N/a    Mask:  N/a  Size: N/a    Mask Change Needed: No    ESS: 6/24    PLMS: No    Chin Strap: No    Humidity: No    Epoch Position Snoring Centrals Apneas Hypopneas AHI/CPAP Pressure TX Low SPO2   0-60 S,L,R Mild - - - 0.0 95   61-120 R - - - - 0.0 95   121-180 R,S Mild - - - 0.0 94   181-240 S  Mild - - 1 0.5 90   241-300 S Mild - - - 0.4 94   301-360 S - - - - 0.3 92   361-420 S - - - - 0.3 94   421-480 S Mild 1 - - 0.5 93   481-540 S Mild - - - 0.4 94   541-600 S - 1 - - 0.6 92   601-660 S,R - - - - 0.5 94   661-720 R - - - - 0.5 95   721-780 S Mild - - - 0.5 94   781-840 S - - - - 0.4 91   841-900 S - 2 - - 0.7 93   901-960 S - - - - 0.6 95   [phone]          1081-1100          1101-1140          1141-1160          1161-1180          1181-1200                Tech Summary: 33 yr old F, Epworth of 6/24. Patient complains of EDS, Daytime fatigues, Sleep Disruption, hard to get to sleep and hard to get back to sleep if awoken in night. Snoring and Apneas are unknown. Recently had a HST that was negative for OSA, however no results are on file. The study was started around 2115 with Lights Out at 2124. Patient fell asleep really quickly with Sleep Onset of about 20 mins. However part of that time was looking at her phone. Once she put it down she was asleep with in 5 mins or so. Overall she slept well. There was some Mild Snoring seen at times and a few Central Apneas. EKG was a Normal Sinus Rhythm all night. Patient slept in the Supine position for most of the night with some time in the Right lateral position. There were a few leg movements but no PLM periods seen. No Split Night study was performed due to an AHI under 5. REM latency was around 60 mins with Supine REM sleep seen a couple of times. Baseline Spo2 was around 95% for most of the study.

## 2019-02-13 ENCOUNTER — Encounter: Admit: 2019-02-13 | Discharge: 2019-02-13

## 2019-02-13 NOTE — Progress Notes
My chart message to patient with results of recent sleep study.

## 2019-02-20 ENCOUNTER — Encounter: Admit: 2019-02-20 | Discharge: 2019-02-20

## 2019-02-20 ENCOUNTER — Ambulatory Visit: Admit: 2019-02-20 | Discharge: 2019-02-20

## 2019-02-20 DIAGNOSIS — E538 Deficiency of other specified B group vitamins: Secondary | ICD-10-CM

## 2019-02-20 DIAGNOSIS — F419 Anxiety disorder, unspecified: Secondary | ICD-10-CM

## 2019-02-20 DIAGNOSIS — R000 Tachycardia, unspecified: Secondary | ICD-10-CM

## 2019-02-20 DIAGNOSIS — E282 Polycystic ovarian syndrome: Secondary | ICD-10-CM

## 2019-02-20 DIAGNOSIS — F4310 Post-traumatic stress disorder, unspecified: Secondary | ICD-10-CM

## 2019-02-20 DIAGNOSIS — K929 Disease of digestive system, unspecified: Secondary | ICD-10-CM

## 2019-02-20 DIAGNOSIS — R7303 Prediabetes: Secondary | ICD-10-CM

## 2019-02-20 DIAGNOSIS — A4902 Methicillin resistant Staphylococcus aureus infection, unspecified site: Secondary | ICD-10-CM

## 2019-02-20 DIAGNOSIS — F329 Major depressive disorder, single episode, unspecified: Secondary | ICD-10-CM

## 2019-02-20 DIAGNOSIS — R011 Cardiac murmur, unspecified: Secondary | ICD-10-CM

## 2019-02-20 DIAGNOSIS — I2510 Atherosclerotic heart disease of native coronary artery without angina pectoris: Secondary | ICD-10-CM

## 2019-02-20 DIAGNOSIS — R404 Transient alteration of awareness: Secondary | ICD-10-CM

## 2019-02-20 DIAGNOSIS — F603 Borderline personality disorder: Secondary | ICD-10-CM

## 2019-02-20 DIAGNOSIS — Z9884 Bariatric surgery status: Secondary | ICD-10-CM

## 2019-02-20 DIAGNOSIS — IMO0002 Unspecified mental or behavioral problem: Secondary | ICD-10-CM

## 2019-02-20 DIAGNOSIS — R6889 Other general symptoms and signs: Secondary | ICD-10-CM

## 2019-02-20 DIAGNOSIS — G43109 Migraine with aura, not intractable, without status migrainosus: Secondary | ICD-10-CM

## 2019-02-20 DIAGNOSIS — F909 Attention-deficit hyperactivity disorder, unspecified type: Secondary | ICD-10-CM

## 2019-02-20 DIAGNOSIS — G4719 Other hypersomnia: Secondary | ICD-10-CM

## 2019-02-20 MED ORDER — ERENUMAB-AOOE 70 MG/ML SC ATIN
70 mg | SUBCUTANEOUS | 11 refills | 28.00000 days | Status: DC
Start: 2019-02-20 — End: 2019-07-30

## 2019-02-20 NOTE — Progress Notes
Telehealth Visit Note    Date of Service: 02/20/2019    Subjective:      Obtained patient's verbal consent to treat them and their agreement to [facility] financial policy and NPP via this telehealth visit during the [facility]       [name] is a 33 y.o. female.    History of Present Illness  [name] is a 33 y.o. female who presents for followup of blackout spells, staring spells, left leg numbness, and migraines.  It was attempted to obtain EEG, EMG, and MRI brain, however, EEG and EMG have not performed.  MRI brain was obtained at the outside facility however we do not have it available in the system.  Staring off spells and blackout spells have subsided, and because she has been distractible with external stimuli, it was deemed to be secondary to excessive somnolence during the day.  She underwent sleep study which ruled out sleep apnea but with showed latency to fall into REM sleep.  She has not had follow-up appointment with Dr. Andria Meuse.  She has history of gastric bypass surgery and was found to have vitamin B12 deficiency, however between December and July, she was not able to get the intramuscular injection of cyanocobalamin due to COVID-19 pandemic.  Her current complaints most concerning is falls and imbalance associated with lightheadedness when she tries to get up since April 2020.  Sometimes she has room spinning sensation.  She did not start taking propranolol 80 mg 3 times daily due to yellow dye component that she is allergic to.  She could not tolerate nortriptyline due to increased somnolence.  She is having daily migrainous headache and not taking any headache medication.  She has also tried several medication including topiramate which did not help.  She had 2 injectable migraine medication tried in the past and 1 of them worked which was every 30 days.  Most recent vitamin A, vitamin D were low, as well as vitamin B12 at 174 (01/30/2019). Review of Systems   Constitutional: Positive for appetite change.   HENT: Positive for sinus pressure.    Eyes: Positive for photophobia.   Respiratory: Positive for cough and chest tightness.    Gastrointestinal: Positive for nausea.   Musculoskeletal: Positive for arthralgias, back pain, gait problem and myalgias.   Allergic/Immunologic: Positive for environmental allergies and food allergies.   Neurological: Positive for dizziness, weakness, light-headedness and headaches.   Hematological: Bruises/bleeds easily.   Psychiatric/Behavioral: Positive for confusion, decreased concentration and sleep disturbance.   All other systems reviewed and are negative.        Objective:         ??? ABILIFY MAINTENA 400 mg injection SYRINGE Inject 400 mg into the muscle every 28 days.   ??? acetaminophen/codeine (TYLENOL #3) 300/30 mg tablet    ??? cholecalciferol (VITAMIN D-3) 1,000 units tablet Take 1,000 Units by mouth daily.   ??? cyanocobalamin (RUBRAMIN) 1,000 mcg/mL injection Inject 1 mL into the muscle every 30 days.   ??? diclofenac (VOLTAREN) 1 % topical gel Apply 2 g topically to affected area four times daily as needed.   ??? erenumab-aooe (AIMOVIG) 70 mg/mL injection syringe Inject 1 mL under the skin every 30 days.   ??? ergocalciferol (VITAMIN D-2) 1,250 mcg (50,000 unit) capsule Take one capsule by mouth twice weekly for 56 days. Indications: vitamin D deficiency (high dose therapy)   ??? famotidine (PEPCID) 20 mg tablet TK 1 T PO BID FOR GASTRITIS   ???  methocarbamoL (ROBAXIN) 500 mg tablet Take 500 mg by mouth every 6 hours as needed.   ??? ondansetron (ZOFRAN ODT) 8 mg rapid dissolve tablet TK 1 T PO QID PRF ACTIVE VOMITING   ??? other medication Take 1 Dose by mouth once. Bariatric chewable.   ??? propranoloL (INDERAL) 20 mg tablet 1 tablet twice daily for 3 days, then 1 tablet three times daily for 3 days, then start long acting   ??? propranolol LA (INDERAL LA) 80 mg capsule Take one capsule by mouth twice daily.     Vitals: 02/20/19 1416   BP: 139/85   BP Source: Arm, Left Upper   Patient Position: Sitting   Pulse: 66   Weight: 83.5 kg (184 lb)   Height: 167.6 cm (66)   PainSc: Zero     Body mass index is 29.7 kg/m???.     Physical Exam  Limited due to telehealth visit.  EOMI, face symmetric, TUP midline, equal shoulder shrug, dysarthric, able to carry conversation without problem.       Assessment and Plan:  [name]???is a 33 y.o.???R handed female???who presents as a new patient for complaints of staring spells, blackouts, and migraine headache.  Problem   Staring Episodes    - Reportedly distractible with external stimuli, low suspicion for seizure  -EEG has not been performed  -MRI head was obtained outside facility, imaging and report unavailable  -Reports no more spells (02/20/2019)     Excessive Daytime Sleepiness    Denies SP/HH/defintive cataplexy  (possible cataplexy?  Anger causes her body to feel heavy and shaky, sometimes trouble talking)     Migraine With Aura and Without Status Migrainosus, Not Intractable    - Daily migrainous headache not on preventative therapy; there is no headache free day  -Multiple preventative therapies failure in the past, including nortriptyline for increased to daytime somnolence, topiramate for unknown reaction, propranolol for allergic reaction to yellow dye (likely not a good option in setting of lightheadedness on the move)  -Patient has tried to injectable preventative therapy for migraine headache starting with A, and reports effectiveness of the 1 that was given every 30 days, likely Aimovig     B12 Deficiency    -Most recent vitamin B12 at 174 (01/30/2019)  -Likely contributing to patient's fall, sense of imbalance, and even migraine headache         B12 deficiency  -Encouraged to continue intramuscular supplementation of vitamin B12, patient status post gastric bypass    Staring episodes  -Continue to monitor, instructed patient to bring imaging in the disc at the next visit in person    Migraine with aura and without status migrainosus, not intractable  -Since patient has failed multiple preventative therapies and a complex medical history, patient likely needs Aimovig for effective migraine therapy  -Resume Aimovig every 30 days    Excessive daytime sleepiness  -Sleep study was negative for sleep apnea, however showed short latency to REM sleep  -Recommended follow-up with Dr. Andria Meuse      RTC in 4 months in person.    Pt was seen and discussed with Dr. Shanda Howells M.D., PGY-3  Neurology Resident  Pager [phone]           30 minutes spent on this patient's encounter with counseling and coordination of care taking >50% of the visit.

## 2019-02-20 NOTE — Telephone Encounter
Pt is a 33 y/o female being seen in neurology clinic for daytime sleepiness. She denies seeing any neurologist outside [facility].    She had MRI of the brain last year. Results in her chart.    She has allergies to yellow dye, sulfa, coconut and mushrooms  Medications reviewed.    Instr MD will call close to time of appointment

## 2019-02-22 NOTE — Assessment & Plan Note
-  Since patient has failed multiple preventative therapies and a complex medical history, patient likely needs Aimovig for effective migraine therapy  -Resume Aimovig every 30 days

## 2019-02-22 NOTE — Assessment & Plan Note
-  Sleep study was negative for sleep apnea, however showed short latency to REM sleep  -Recommended follow-up with Dr. Gerilyn Nestle

## 2019-02-22 NOTE — Assessment & Plan Note
-  Encouraged to continue intramuscular supplementation of vitamin B12, patient status post gastric bypass

## 2019-02-23 ENCOUNTER — Encounter: Admit: 2019-02-23 | Discharge: 2019-02-23

## 2019-02-23 NOTE — Telephone Encounter
Sent message to Dr. Farris Has for further advisement on next plan of treatment.

## 2019-02-23 NOTE — Telephone Encounter
Message from Plan  Unable to approve Unable to approve AIMOVIG (Erenumab-aooe Subcutaneous Solution Auto-Injector 70 MG/ML) due to unmet criteria (3). The following criteria has not been met: Please use the following preferred formulary medications: botulinum toxin for a least 180 days unless medical intolerance or allergy (generic when available) Per the [place] Department [employer] and [employer] ([employer]) Clinical Criteria for Calcitonin Gene-Related Peptide (CGRP) Antagonists, member must meet all of the following: 1. Patient has a diagnosis of chronic or episodic migraine a. Chronic migraine: 15 or more headache days per month, for more than three months, which, on at least 8 days/month, has the features of migraine headache. b. Episodic migraine: 4 to 14 migraine days per month. 2. Patient must have experienced an inadequate response after a trial of at least one agent from each medication class a maximum tolerated dose, OR have a documented intolerance or contraindication to all preventive therapies. 3. Patient must have experienced an inadequate response to a trial of a botulinum toxin indicated for chronic migraines (trial of at least 180 days), OR have a documented intolerance or contraindication to treatment with botulinum toxins. a. Treatment with a botulinum toxin for chronic migraines must be discontinued prior to initiation with a CGRP antagonist. i. At least 90 days must have elapsed after last treatment with botulinum toxin. 4. Prescriber must provide documentation of all previous medication trials. Documentation must include the medication name(s), trial date(s) and outcome(s) of the trial (i.e. inadequate response, intolerance or contraindication). 5. Prescriber must attest that all medication-specific safety criteria is met More information about this criteria may be found on the following website: [email or web address].pdf

## 2019-02-23 NOTE — Telephone Encounter
Prior Authorization started via Cover My Meds with Key: [id number] which was sent to [facility] for further approval status.      [place] [phone]

## 2019-03-05 ENCOUNTER — Encounter: Admit: 2019-03-05 | Discharge: 2019-03-05

## 2019-03-05 NOTE — Progress Notes
I have personally interviewed and examined [name] and reviewed the history, examination, impression, and plan of care as outlined by Dr.Mai Farris Has, MD. I personally  participated in patient counseling and coordination of care.  I agree with the assessment and plan as documented by Dr. Marcelino Scot, MD.

## 2019-03-05 NOTE — Telephone Encounter
Please let the patient know that Aimovig was denied by insurance. She told me that she was never on Botox injection. If she is open to start Botox treatment, we can proceed with Botox injection first.    Thank you,  Marcelino Scot, MD  PGY-3 Neurology resident

## 2019-03-05 NOTE — Telephone Encounter
Prior Authorization to begin Botox therapy was started via Cover My Meds to [facility].  (Key: [id number])

## 2019-03-05 NOTE — Telephone Encounter
Spoke with patient and she is open to trying Botox therapy.  Will let Dr. Farris Has know patients decision and see when she would like to start patients therapy.

## 2019-03-09 ENCOUNTER — Encounter: Admit: 2019-03-09 | Discharge: 2019-03-09

## 2019-03-09 NOTE — Telephone Encounter
Prior authorization for botox approved through [facility]  Effective 03/27/2019-09/24/2019

## 2019-07-30 ENCOUNTER — Ambulatory Visit: Admit: 2019-07-30 | Discharge: 2019-07-31 | Payer: MEDICAID

## 2019-07-30 ENCOUNTER — Encounter: Admit: 2019-07-30 | Discharge: 2019-07-30

## 2019-07-30 DIAGNOSIS — F4310 Post-traumatic stress disorder, unspecified: Secondary | ICD-10-CM

## 2019-07-30 DIAGNOSIS — K929 Disease of digestive system, unspecified: Secondary | ICD-10-CM

## 2019-07-30 DIAGNOSIS — F909 Attention-deficit hyperactivity disorder, unspecified type: Secondary | ICD-10-CM

## 2019-07-30 DIAGNOSIS — F329 Major depressive disorder, single episode, unspecified: Secondary | ICD-10-CM

## 2019-07-30 DIAGNOSIS — IMO0002 Unspecified mental or behavioral problem: Secondary | ICD-10-CM

## 2019-07-30 DIAGNOSIS — F419 Anxiety disorder, unspecified: Secondary | ICD-10-CM

## 2019-07-30 DIAGNOSIS — E282 Polycystic ovarian syndrome: Secondary | ICD-10-CM

## 2019-07-30 DIAGNOSIS — R000 Tachycardia, unspecified: Secondary | ICD-10-CM

## 2019-07-30 DIAGNOSIS — A4902 Methicillin resistant Staphylococcus aureus infection, unspecified site: Secondary | ICD-10-CM

## 2019-07-30 DIAGNOSIS — R011 Cardiac murmur, unspecified: Secondary | ICD-10-CM

## 2019-07-30 DIAGNOSIS — F603 Borderline personality disorder: Secondary | ICD-10-CM

## 2019-07-30 DIAGNOSIS — R7303 Prediabetes: Secondary | ICD-10-CM

## 2019-07-30 DIAGNOSIS — R6889 Other general symptoms and signs: Secondary | ICD-10-CM

## 2019-07-30 DIAGNOSIS — I2510 Atherosclerotic heart disease of native coronary artery without angina pectoris: Secondary | ICD-10-CM

## 2019-07-30 NOTE — Progress Notes
CC: Morbid obesity, status post  L-RYGB seen today for 75m follow up appointment.    Subjective:   [name] is a 34 y.o. status post above.  Patient feels good with her weight loss success.  Was having issues with spicy foods- nausea and emesis.  Has eliminated these foods and symptoms now resolved.  Is focusing on eating more slowly, chewing every bite thoroughly as she realized that with eating too quickly she was having dysphagia and occasional emesis.  Focusing on protein first.  Has eliminated fast foods such as fries and chips.  Has eliminated soda as well.     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 Yes - - - - - -     Measurements  01/30/2019 10/28/2018 04/22/2018 02/21/2018 01/31/2018 01/07/2018 07/30/2017   BMI 30.5 32.2 39.8 44.3 46.9 49.1 48.2   Weight 189 lb 8 oz 200 lb 246 lb 8 oz 274 lb 9.6 oz 290 lb 8 oz 305 lb 6.4 oz 298 lb 9.6 oz   Right Upper Arm Circumference (Inches) - - 19 - - 20.5 15.5   Waist Circumference (Inches) - - 44.5 - - 55 49   Hip Circumference (Inches) - - 54 - - 60.25 50   Right Thigh Circumference (Inches) - - 27.75 - - 29 25     No flowsheet data found.      Gen:  A/ox3, nad  Derm:  No evidence of rashes  CV:  RRR  Chest:  CTAB Abdomen - soft, nondistended, incision sites c/d/i, no evidence of hernia.,  Patient with superficial skin breakdown to pannus and thighs.  Extr:  As above, no edema  Neuro: CNII-XII grossly intact, no focal deficits    Assessment/Plan:  34 y.o. female s/p L-RYGB with current Body mass index is 27.37 kg/m?Marland Kitchen  Refer to plastic surgery given excellent weight loss but persistent skin breakdown despite current regimens.    Labs ordered for 2 year visit.    Counseled the patient regarding mindfulness with eating.  Discussed allowing at least 20 minutes for meals, chewing each bite thoroughly, and avoiding liquids with meals.  We discussed the importance of maintaining adequate protein intake.  We discussed adequate cardiovascular exercise and strength training.  Discussed recommendations for weekly exercise for weight maintenance and overall health.      [name]  07/30/19         Total time 25 minutes.  Estimated counseling time 15 minutes.  Counseled patient as delineated above.    Bariatric Follow-Up  General  Height: @LASTHT :1@  Weight:@LASTWT :4@  BMI: @BMI :4@  Bariatric Review Flowsheet Brief 01/30/2019 10/28/2018 04/22/2018 02/21/2018 01/31/2018 01/07/2018 07/30/2017   Type of Visit Post-Op Post-Op Post-Op Post-Op Post-Op Pre-Op Initial Consult   BMI 30.5 32.2 39.8 44.3 46.9 49.1 48.2   Weight 189 lb 8 oz 200 lb 246 lb 8 oz 274 lb 9.6 oz 290 lb 8 oz 305 lb 6.4 oz 298 lb 9.6 oz       Comorbidity  Sleep apnea? No     Hyperlipidemia requiring medications: No     GERD requiring medications: Yes     Hypertension requiring medications: No     Diabetes: No   insulin dependent:  n/a    Current medications:  Current Outpatient Medications on File Prior to Visit   Medication Sig Dispense Refill   ? ABILIFY MAINTENA 400 mg injection SYRINGE Inject 400 mg into the muscle every 28 days. ? cholecalciferol (VITAMIN D-3) 1,000 units tablet Take 1,000 Units by mouth daily.     ? cyanocobalamin (RUBRAMIN) 1,000 mcg/mL injection Inject 1 mL into the muscle every 30 days.     ? diclofenac (VOLTAREN) 1 % topical gel Apply 2 g topically to affected area four times daily as needed.     ? methocarbamoL (ROBAXIN) 500 mg tablet Take 500 mg by mouth every 6 hours as needed.     ? ondansetron (ZOFRAN ODT) 8 mg rapid dissolve tablet TK 1 T PO QID PRF ACTIVE VOMITING     ? other medication Take 1 Dose by mouth once. Bariatric chewable.       No current facility-administered medications on file prior to visit.

## 2019-07-31 DIAGNOSIS — Z9884 Bariatric surgery status: Secondary | ICD-10-CM

## 2019-08-14 ENCOUNTER — Encounter: Admit: 2019-08-14 | Discharge: 2019-08-14

## 2019-08-14 NOTE — Telephone Encounter
Contacted patient to verify her appointment today for Botox and patient cancelled appointment with no reason for cancellation.

## 2019-09-02 ENCOUNTER — Encounter: Admit: 2019-09-02 | Discharge: 2019-09-02

## 2019-09-02 DIAGNOSIS — R110 Nausea: Secondary | ICD-10-CM

## 2019-09-02 DIAGNOSIS — R112 Nausea with vomiting, unspecified: Secondary | ICD-10-CM

## 2019-09-02 DIAGNOSIS — Z9884 Bariatric surgery status: Secondary | ICD-10-CM

## 2019-09-02 MED ORDER — PROMETHAZINE 25 MG/ML IJ SOLN
25 mg | Freq: Once | INTRAVENOUS | 0 refills
Start: 2019-09-02 — End: ?

## 2019-09-02 MED ORDER — THIAMINE HCL (VITAMIN B1) 100 MG/ML IJ SOLN
100 mg | Freq: Once | INTRAVENOUS | 0 refills
Start: 2019-09-02 — End: ?

## 2019-09-02 MED ORDER — SODIUM CHLORIDE 0.9 % IV SOLP
2000 mL | Freq: Once | INTRAVENOUS | 0 refills
Start: 2019-09-02 — End: ?

## 2019-09-02 MED ORDER — PRIOR AUTHORIZATION PLACEHOLDER (DO NOT RELEASE)
Freq: Once | 0 refills
Start: 2019-09-02 — End: ?

## 2019-09-03 ENCOUNTER — Encounter: Admit: 2019-09-03 | Discharge: 2019-09-03

## 2019-09-07 ENCOUNTER — Encounter: Admit: 2019-09-07 | Discharge: 2019-09-07

## 2019-09-28 ENCOUNTER — Encounter: Admit: 2019-09-28 | Discharge: 2019-09-28

## 2019-09-28 NOTE — Telephone Encounter
Order signed and sent back to [facility] HIM department as requested.

## 2019-12-07 ENCOUNTER — Encounter: Admit: 2019-12-07 | Discharge: 2019-12-07

## 2019-12-08 ENCOUNTER — Encounter: Admit: 2019-12-08 | Discharge: 2019-12-08

## 2019-12-08 ENCOUNTER — Inpatient Hospital Stay: Admit: 2019-12-08 | Payer: MEDICAID

## 2019-12-08 DIAGNOSIS — F909 Attention-deficit hyperactivity disorder, unspecified type: Secondary | ICD-10-CM

## 2019-12-08 DIAGNOSIS — R011 Cardiac murmur, unspecified: Secondary | ICD-10-CM

## 2019-12-08 DIAGNOSIS — A4902 Methicillin resistant Staphylococcus aureus infection, unspecified site: Secondary | ICD-10-CM

## 2019-12-08 DIAGNOSIS — R7303 Prediabetes: Secondary | ICD-10-CM

## 2019-12-08 DIAGNOSIS — F4310 Post-traumatic stress disorder, unspecified: Secondary | ICD-10-CM

## 2019-12-08 DIAGNOSIS — E282 Polycystic ovarian syndrome: Secondary | ICD-10-CM

## 2019-12-08 DIAGNOSIS — F603 Borderline personality disorder: Secondary | ICD-10-CM

## 2019-12-08 DIAGNOSIS — R569 Unspecified convulsions: Secondary | ICD-10-CM

## 2019-12-08 DIAGNOSIS — IMO0002 Unspecified mental or behavioral problem: Secondary | ICD-10-CM

## 2019-12-08 DIAGNOSIS — F419 Anxiety disorder, unspecified: Secondary | ICD-10-CM

## 2019-12-08 DIAGNOSIS — R000 Tachycardia, unspecified: Secondary | ICD-10-CM

## 2019-12-08 DIAGNOSIS — R6889 Other general symptoms and signs: Secondary | ICD-10-CM

## 2019-12-08 DIAGNOSIS — K929 Disease of digestive system, unspecified: Secondary | ICD-10-CM

## 2019-12-08 DIAGNOSIS — F329 Major depressive disorder, single episode, unspecified: Secondary | ICD-10-CM

## 2019-12-08 DIAGNOSIS — I2510 Atherosclerotic heart disease of native coronary artery without angina pectoris: Secondary | ICD-10-CM

## 2019-12-08 DIAGNOSIS — R45851 Suicidal ideations: Secondary | ICD-10-CM

## 2019-12-08 MED ORDER — HYDROXYZINE HCL 25 MG PO TAB
25 mg | ORAL | 0 refills | Status: DC | PRN
Start: 2019-12-08 — End: 2019-12-10
  Administered 2019-12-09 – 2019-12-10 (×2): 25 mg via ORAL

## 2019-12-08 MED ORDER — OLANZAPINE 10 MG IM SOLR
5 mg | INTRAMUSCULAR | 0 refills | Status: DC | PRN
Start: 2019-12-08 — End: 2019-12-12

## 2019-12-08 MED ORDER — ACETAMINOPHEN 325 MG PO TAB
650 mg | ORAL | 0 refills | Status: DC | PRN
Start: 2019-12-08 — End: 2019-12-12
  Administered 2019-12-10 – 2019-12-11 (×4): 650 mg via ORAL

## 2019-12-08 MED ORDER — OLANZAPINE 5 MG PO TBDI
5 mg | ORAL | 0 refills | Status: DC | PRN
Start: 2019-12-08 — End: 2019-12-09

## 2019-12-08 MED ORDER — LORAZEPAM 2 MG/ML IJ SOLN
2 mg | Freq: Once | INTRAMUSCULAR | 0 refills | Status: AC | PRN
Start: 2019-12-08 — End: ?

## 2019-12-08 MED ORDER — POLYETHYLENE GLYCOL 3350 17 GRAM PO PWPK
1 | Freq: Every day | ORAL | 0 refills | Status: DC | PRN
Start: 2019-12-08 — End: 2019-12-12

## 2019-12-08 MED ORDER — TRAZODONE 50 MG PO TAB
50 mg | Freq: Every evening | ORAL | 0 refills | Status: DC | PRN
Start: 2019-12-08 — End: 2019-12-10
  Administered 2019-12-09: 02:00:00 50 mg via ORAL

## 2019-12-08 MED ORDER — CALCIUM CARBONATE 200 MG CALCIUM (500 MG) PO CHEW
500 mg | ORAL | 0 refills | Status: DC | PRN
Start: 2019-12-08 — End: 2019-12-12

## 2019-12-08 NOTE — Progress Notes
N2N Report:   Name/Title of person providing N2N Information:   Oncologist, ASA, Coconut, Nsaids, Sulfa, yellow dye, BBQ sauce     Most Recent Vitals:  BP 94/51   HR 66    RR 16    SPO2 97% RA    Temp 97.9 f   at 1100   If Diabetic, FSBS? 84 on labs  Pain? HA 4/10     Location of Pt: M/S unit     Current Psychiatric Symptoms:  SI with history of cutting. 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 yes  Eating/Drinking? yes  Ambulating? Independently   Urinating? yes     Voluntary Status? Voluntary        Labs Completed?   UA-negative  UDS-negative  covid-negative  etoh-negative  Urine pregnancy-negative        Additional Information:  Skin issues-none     ETA? TBD     Form Completed by: Jonny Ruiz, RN      Date: 12/08/19      Time: 6440

## 2019-12-08 NOTE — Progress Notes
Called [facility] to inquire if patient had left the unit. Calie RN reports patient is still on the unit, and has not left the facility. Requested for RN to call when patient is leaving facility. Number to call given [phone].

## 2019-12-09 MED ORDER — NORELGESTROMIN-ETHIN.ESTRADIOL 150-35 MCG/24 HR TD PTWK
1 | TRANSDERMAL | 0 refills | Status: DC
Start: 2019-12-09 — End: 2019-12-12
  Administered 2019-12-09: 21:00:00 1 via TRANSDERMAL

## 2019-12-09 MED ORDER — ARIPIPRAZOLE 400 MG IM SERS
400 mg | Freq: Once | INTRAMUSCULAR | 0 refills | Status: CP
Start: 2019-12-09 — End: ?
  Administered 2019-12-09: 21:00:00 400 mg via INTRAMUSCULAR

## 2019-12-09 MED ORDER — NICOTINE 21 MG/24 HR TD PT24
1 | Freq: Every day | TRANSDERMAL | 0 refills | Status: DC
Start: 2019-12-09 — End: 2019-12-12
  Administered 2019-12-09 – 2019-12-11 (×3): 1 via TRANSDERMAL

## 2019-12-09 MED ORDER — ARIPIPRAZOLE 5 MG PO TAB
5 mg | Freq: Every day | ORAL | 0 refills | Status: DC
Start: 2019-12-09 — End: 2019-12-12
  Administered 2019-12-09 – 2019-12-11 (×3): 5 mg via ORAL

## 2019-12-09 MED ORDER — OLANZAPINE 5 MG PO TBDI
5 mg | ORAL | 0 refills | Status: DC | PRN
Start: 2019-12-09 — End: 2019-12-12
  Administered 2019-12-10: 18:00:00 5 mg via ORAL

## 2019-12-09 MED ORDER — NICOTINE (POLACRILEX) 4 MG BU GUM
4 mg | BUCCAL | 0 refills | Status: DC | PRN
Start: 2019-12-09 — End: 2019-12-12
  Administered 2019-12-09 – 2019-12-11 (×2): 4 mg via BUCCAL

## 2019-12-09 MED ORDER — OLANZAPINE 10 MG PO TBDI
5 mg | ORAL | 0 refills | Status: DC | PRN
Start: 2019-12-09 — End: 2019-12-09
  Administered 2019-12-09: 18:00:00 5 mg via ORAL
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
## 2019-12-09 NOTE — Care Plan
Problem: Discharge Planning  Goal: Participation in plan of care  Outcome: Goal Ongoing  Flowsheets (Taken 12/08/2019 2214)  Participation in Plan of Care: Involve patient/caregiver in care planning decision making     Problem: Harm to self, high risk of suicide  Goal: Absence of Harm to Self  Outcome: Goal Ongoing  Flowsheets (Taken 12/08/2019 2214)  Absence of harm to self:   Assess and report significant changes in mood and affect   Complete the environmental risk assessment   Maintain a safe environment   Reassess for suicide risk daily     Problem: Health Maintenance - Impaired  Goal: Establish therapeutic relationships  Outcome: Goal Ongoing  Flowsheets (Taken 12/08/2019 2214)  Establish therapeutic relationships:   Encourage expressions of feelings   Involve patient in decision-making process   Assist patient in setting realistic expectations

## 2019-12-10 MED ORDER — HYDROXYZINE HCL 25 MG PO TAB
50 mg | ORAL | 0 refills | Status: DC | PRN
Start: 2019-12-10 — End: 2019-12-12
  Administered 2019-12-11 (×2): 50 mg via ORAL

## 2019-12-11 ENCOUNTER — Encounter: Admit: 2019-12-11 | Discharge: 2019-12-11

## 2019-12-11 MED ORDER — ABILIFY MAINTENA 400 MG IM SERR
400 mg | INTRAMUSCULAR | 0 refills | Status: DC
Start: 2019-12-11 — End: 2020-01-31

## 2019-12-11 MED ORDER — ONDANSETRON 4 MG PO TBDI
4 mg | ORAL | 0 refills | Status: DC | PRN
Start: 2019-12-11 — End: 2019-12-12
  Administered 2019-12-11: 19:00:00 4 mg via ORAL

## 2019-12-11 MED ORDER — NICOTINE (POLACRILEX) 4 MG BU GUM
4 mg | BUCCAL | 0 refills | Status: DC | PRN
Start: 2019-12-11 — End: 2020-01-28
  Filled 2019-12-11: qty 220, 10d supply, fill #1

## 2019-12-11 MED ORDER — METRONIDAZOLE 500 MG PO TAB
500 mg | ORAL_TABLET | Freq: Two times a day (BID) | ORAL | 0 refills | Status: DC
Start: 2019-12-11 — End: 2020-01-28
  Filled 2019-12-11: qty 13, 7d supply, fill #1

## 2019-12-11 MED ORDER — NICOTINE 21 MG/24 HR TD PT24
1 | MEDICATED_PATCH | Freq: Every day | TRANSDERMAL | 0 refills | Status: DC
Start: 2019-12-11 — End: 2020-01-28
  Filled 2019-12-11: qty 28, 28d supply, fill #1

## 2019-12-11 MED ORDER — ARIPIPRAZOLE 5 MG PO TAB
5 mg | ORAL_TABLET | Freq: Every day | ORAL | 0 refills | Status: DC
Start: 2019-12-11 — End: 2020-01-28
  Filled 2019-12-11: qty 12, 12d supply, fill #1

## 2019-12-11 MED ORDER — COVID-19 VAC,AD26(JANSSEN)-PF 0.5 ML IM SUSP
.5 mL | Freq: Once | INTRAMUSCULAR | 0 refills | Status: CP
Start: 2019-12-11 — End: ?
  Administered 2019-12-11: 17:00:00 0.5 mL via INTRAMUSCULAR

## 2019-12-11 MED ORDER — HYDROXYZINE HCL 50 MG PO TAB
50 mg | ORAL_TABLET | ORAL | 0 refills | 30.00000 days | Status: DC | PRN
Start: 2019-12-11 — End: 2020-01-28
  Filled 2019-12-11: qty 30, 8d supply, fill #1

## 2019-12-11 MED ORDER — METRONIDAZOLE 500 MG PO TAB
500 mg | Freq: Two times a day (BID) | ORAL | 0 refills | Status: DC
Start: 2019-12-11 — End: 2019-12-12
  Administered 2019-12-11: 19:00:00 500 mg via ORAL

## 2019-12-15 ENCOUNTER — Encounter: Admit: 2019-12-15 | Discharge: 2019-12-15

## 2020-01-22 ENCOUNTER — Encounter: Admit: 2020-01-22 | Discharge: 2020-01-22

## 2020-01-22 ENCOUNTER — Ambulatory Visit: Admit: 2020-01-22 | Discharge: 2020-01-23 | Payer: MEDICAID

## 2020-01-22 DIAGNOSIS — F4310 Post-traumatic stress disorder, unspecified: Secondary | ICD-10-CM

## 2020-01-22 DIAGNOSIS — K289 Gastrojejunal ulcer, unspecified as acute or chronic, without hemorrhage or perforation: Secondary | ICD-10-CM

## 2020-01-22 DIAGNOSIS — A4902 Methicillin resistant Staphylococcus aureus infection, unspecified site: Secondary | ICD-10-CM

## 2020-01-22 DIAGNOSIS — R569 Unspecified convulsions: Secondary | ICD-10-CM

## 2020-01-22 DIAGNOSIS — R7303 Prediabetes: Secondary | ICD-10-CM

## 2020-01-22 DIAGNOSIS — R011 Cardiac murmur, unspecified: Secondary | ICD-10-CM

## 2020-01-22 DIAGNOSIS — R000 Tachycardia, unspecified: Secondary | ICD-10-CM

## 2020-01-22 DIAGNOSIS — F603 Borderline personality disorder: Secondary | ICD-10-CM

## 2020-01-22 DIAGNOSIS — K929 Disease of digestive system, unspecified: Secondary | ICD-10-CM

## 2020-01-22 DIAGNOSIS — E282 Polycystic ovarian syndrome: Secondary | ICD-10-CM

## 2020-01-22 DIAGNOSIS — F909 Attention-deficit hyperactivity disorder, unspecified type: Secondary | ICD-10-CM

## 2020-01-22 DIAGNOSIS — F419 Anxiety disorder, unspecified: Secondary | ICD-10-CM

## 2020-01-22 DIAGNOSIS — Z9884 Bariatric surgery status: Secondary | ICD-10-CM

## 2020-01-22 DIAGNOSIS — K283 Acute gastrojejunal ulcer without hemorrhage or perforation: Secondary | ICD-10-CM

## 2020-01-22 DIAGNOSIS — I2510 Atherosclerotic heart disease of native coronary artery without angina pectoris: Secondary | ICD-10-CM

## 2020-01-22 DIAGNOSIS — IMO0002 Unspecified mental or behavioral problem: Secondary | ICD-10-CM

## 2020-01-22 DIAGNOSIS — R6889 Other general symptoms and signs: Secondary | ICD-10-CM

## 2020-01-22 DIAGNOSIS — F329 Major depressive disorder, single episode, unspecified: Secondary | ICD-10-CM

## 2020-01-22 MED ORDER — LANSOPRAZOLE 30 MG PO CPDR
30 mg | ORAL_CAPSULE | Freq: Two times a day (BID) | ORAL | 0 refills | 30.00000 days | Status: AC
Start: 2020-01-22 — End: ?

## 2020-01-22 MED ORDER — SUCRALFATE 1 GRAM PO TAB
1 g | ORAL_TABLET | ORAL | 0 refills | Status: AC
Start: 2020-01-22 — End: ?

## 2020-01-22 NOTE — Progress Notes
CC: Morbid obesity, status post laparoscopic Roux-en-Y seen today for 2 year follow up appointment.    Subjective:   [name] is a 34 y.o. status post above.  She is doing well.  She is staying hydrated and her diet has been good.  She is walking for exercise.  She has epigastric abdominal pain that has been present for a couple weeks.  She states the onset is associated with increased physical activity as well as eating.  She describes the pain to be radiating to bilateral lower ribs and sharp in nature.  She has taken Tylenol and Ibuprofen.  She has also resumed smoking postoperatively and quit about a month ago. She denies any nausea or vomiting.      PE:   Vitals:    01/22/20 0926   BP: 122/55   Pulse: 80   Temp: 37.4 ?C (99.4 ?F)   TempSrc: Temporal   SpO2: 100%   Weight: 76.7 kg (169 lb 1.6 oz)   Height: 167.6 cm (66)   PainSc: Zero       Body mass index is 27.29 kg/m?Marland Kitchen    Bariatric Surgery  01/22/2020 07/30/2019 01/30/2019 10/28/2018 04/22/2018 02/21/2018 01/31/2018   Type of Surgery Roux-en-y Gastric Bypass Roux-en-y Gastric Bypass Roux-en-y Gastric Bypass Roux-en-y Gastric Bypass Roux-en-y Gastric Bypass Roux-en-y Gastric Bypass Roux-en-y Gastric Bypass     Visit Details  01/22/2020 07/30/2019 01/30/2019 10/28/2018 04/22/2018 02/21/2018 01/31/2018   Surgery date 01/20/2018 01/20/2018 01/20/2018 01/20/2018 01/20/2018 01/20/2018 02/10/2018   Type of Visit Post-Op Post-Op Post-Op Post-Op Post-Op Post-Op Post-Op   Have you received a flu vaccine this season? Yes Yes Yes - - - -     Measurements  01/22/2020 07/30/2019 01/30/2019 10/28/2018 04/22/2018 02/21/2018 01/31/2018   BMI 27.29 27.3 30.5 32.2 39.8 44.3 46.9   Weight 169 lb 1.6 oz 169 lb 9.6 oz 189 lb 8 oz 200 lb 246 lb 8 oz 274 lb 9.6 oz 290 lb 8 oz   Right Upper Arm Circumference (Inches) - - - - 19 - -   Waist Circumference (Inches) - - - - 44.5 - -   Hip Circumference (Inches) - - - - 54 - -   Right Thigh Circumference (Inches) - - - - 27.75 - -     No flowsheet data found. Physical Exam   Constitutional: She is oriented to person, place, and time and well-developed, well-nourished, and in no distress.   Pulmonary/Chest: Effort normal. No respiratory distress.   Abdominal: Soft. She exhibits no distension and no mass. There is abdominal tenderness (ttp over epigastric region and minimal ttp over RUQ > LUQ).   Neurological: She is alert and oriented to person, place, and time.   Skin: Skin is warm and dry.   Vitals reviewed.      Assessment/Plan:  34 y.o. female s/p laparoscopic Roux-en-Y with a baseline BMI of 49.1 and current BMI of 27.29.  We discussed the concern for marginal ulcer formation with her given symptoms and smoking history.  We discussed in detail the significance of a lifetime of smoking cessation as well as not being around secondhand smoke, in addition to other risk factors, such as NSAID and prednisone use for marginal ulcer formation.  We will prescribe her Prevacid twice daily for 6 weeks and Carafate for 2 weeks for marginal ulcer prevention.  She will follow-up with [place] via a phone call in 6 weeks to let us know how she is doing.  Otherwise, she will obtain labs and  then see plastic Surgery for excess skin removal evaluation on February 09, 2020.      Pre-operative comorbidities: GERD      Counseled the patient regarding mindfulness with eating.  Discussed allowing at least 20 minutes for meals, chewing each bite thoroughly, and avoiding liquids with meals.  We discussed the importance of maintaining adequate protein intake.  We discussed adequate cardiovascular exercise and strength training.  Discussed recommendations for weekly exercise for weight maintenance and overall health.      Mickey Farber, MD, PGY-1                                                                            01/22/20   General Surgery         Total time 30 minutes.  Estimated counseling time 20 minutes.  Counseled patient as delineated above.    Bariatric Follow-Up  General    Bariatric Review Flowsheet Brief 01/22/2020 07/30/2019 01/30/2019 10/28/2018 04/22/2018 02/21/2018 01/31/2018   Type of Visit Post-Op Post-Op Post-Op Post-Op Post-Op Post-Op Post-Op   BMI 27.29 27.3 30.5 32.2 39.8 44.3 46.9   Weight 169 lb 1.6 oz 169 lb 9.6 oz 189 lb 8 oz 200 lb 246 lb 8 oz 274 lb 9.6 oz 290 lb 8 oz       Comorbidity  Sleep apnea? No     Hyperlipidemia requiring medications: No     GERD requiring medications: Yes     Hypertension requiring medications: No     Diabetes: No   insulin dependent:  NA    Current medications:  Current Outpatient Medications on File Prior to Visit   Medication Sig Dispense Refill   ? ARIPiprazole (ABILIFY MAINTENA) 400 mg injectable VIAL Inject four hundred mg into the muscle every 28 days. Received on 12/09/19; oral overlap to stop 12/23/19; next injection due 01/06/20 1 each 0   ? ARIPiprazole (ABILIFY) 5 mg tablet Take one tablet by mouth daily. Stop on 6/09. 12 tablet 0   ? cholecalciferol (VITAMIN D-3) 1,000 units tablet Take 1,000 Units by mouth daily.     ? cyclobenzaprine (FLEXERIL) 10 mg tablet Take 10 mg by mouth three times daily as needed for Muscle Cramps.     ? ethinyl estradiol-norelgestrom (ORTHO EVRA) 150-35 mcg/24 hr transdermal patch Apply 1 patch to top of skin as directed every 7 days.     ? ferrous sulfate (IRON PO) Take  by mouth.     ? hydrOXYzine (ATARAX) 50 mg tablet Take one tablet by mouth every 6 hours as needed. 30 tablet 0   ? metroNIDAZOLE (FLAGYL) 500 mg tablet Take one tablet by mouth twice daily. Take with food. Do not drink alcohol while on metronidazole. 13 tablet 0   ? nicotine (NICODERM CQ STEP 1) 21 mg/day patch Apply one patch to top of skin as directed daily. Rotate patch location.  Indications: stop smoking 28 patch 0   ? nicotine polacrilex (NICORETTE) 4 mg gum Place one each inside cheek (side of mouth) every 1 hour as needed. Chew to soften and park in mouth between lip and gum. 220 each 0   ? oxyCODONE-acetaminophen (PERCOCET) 7.5-325 mg tablet Take 1 tablet by mouth every 6 hours  as needed for Pain     ? oxyCODONE/acetaminophen (PERCOCET) 10/325 mg tablet Take 1 tablet by mouth every 6 hours as needed for Pain     ? oxyCODONE/acetaminophen (PERCOCET) 10/325 mg tablet Take 1 tablet by mouth every 6 hours as needed for Pain       No current facility-administered medications on file prior to visit.       MVI patch: No   MVI:  Yes      Calcium: No, will obtain after this visit  Calc.&Vit. D: No   Iron: Yes       Vit. B12: No   Vit.D: Yes   Vit. A/D/E/K: No     ATTESTATION    I personally performed the key portions of the E/M visit, discussed case with resident and concur with resident documentation of history, physical exam, assessment, and treatment plan unless otherwise noted.   Patient with recent smoking and persistent 2nd hand smoke exposure, taking NSAIDs for epigastric pain.  We again discussed contraindication of smoking, smoke exposure, NSAIDs, steroids due to risk of marginal ulcer.  Her symptoms are consistent with marginal ulcer.  Rx marginal ulcer regimen.  Patient to call if persistent symptoms after treatment course.  Check annual labs; not yet done.  RTC 1 year for annual bariatric follow up appt.    Staff name:  Bufford Lope, MD Date:  01/22/2020

## 2020-01-27 ENCOUNTER — Encounter: Admit: 2020-01-27 | Discharge: 2020-01-27

## 2020-01-28 ENCOUNTER — Inpatient Hospital Stay: Admit: 2020-01-28 | Payer: MEDICAID

## 2020-01-28 ENCOUNTER — Encounter: Admit: 2020-01-28 | Discharge: 2020-01-28

## 2020-01-28 DIAGNOSIS — R011 Cardiac murmur, unspecified: Secondary | ICD-10-CM

## 2020-01-28 DIAGNOSIS — R000 Tachycardia, unspecified: Secondary | ICD-10-CM

## 2020-01-28 DIAGNOSIS — A4902 Methicillin resistant Staphylococcus aureus infection, unspecified site: Secondary | ICD-10-CM

## 2020-01-28 DIAGNOSIS — K929 Disease of digestive system, unspecified: Secondary | ICD-10-CM

## 2020-01-28 DIAGNOSIS — F603 Borderline personality disorder: Secondary | ICD-10-CM

## 2020-01-28 DIAGNOSIS — R569 Unspecified convulsions: Secondary | ICD-10-CM

## 2020-01-28 DIAGNOSIS — R7303 Prediabetes: Secondary | ICD-10-CM

## 2020-01-28 DIAGNOSIS — F4310 Post-traumatic stress disorder, unspecified: Secondary | ICD-10-CM

## 2020-01-28 DIAGNOSIS — F329 Major depressive disorder, single episode, unspecified: Secondary | ICD-10-CM

## 2020-01-28 DIAGNOSIS — F909 Attention-deficit hyperactivity disorder, unspecified type: Secondary | ICD-10-CM

## 2020-01-28 DIAGNOSIS — R45851 Suicidal ideations: Secondary | ICD-10-CM

## 2020-01-28 DIAGNOSIS — F419 Anxiety disorder, unspecified: Secondary | ICD-10-CM

## 2020-01-28 DIAGNOSIS — I2510 Atherosclerotic heart disease of native coronary artery without angina pectoris: Secondary | ICD-10-CM

## 2020-01-28 DIAGNOSIS — R6889 Other general symptoms and signs: Secondary | ICD-10-CM

## 2020-01-28 DIAGNOSIS — E282 Polycystic ovarian syndrome: Secondary | ICD-10-CM

## 2020-01-28 DIAGNOSIS — IMO0002 Unspecified mental or behavioral problem: Secondary | ICD-10-CM

## 2020-01-28 MED ORDER — PANTOPRAZOLE 40 MG PO TBEC
40 mg | Freq: Every day | ORAL | 0 refills | Status: DC
Start: 2020-01-28 — End: 2020-01-28

## 2020-01-28 MED ORDER — NORELGESTROMIN-ETHIN.ESTRADIOL 150-35 MCG/24 HR TD PTWK
1 | TRANSDERMAL | 0 refills | Status: CN
Start: 2020-01-28 — End: ?

## 2020-01-28 MED ORDER — NICOTINE 21 MG/24 HR TD PT24
1 | Freq: Every day | TRANSDERMAL | 0 refills | Status: CN
Start: 2020-01-28 — End: ?

## 2020-01-28 MED ORDER — SUCRALFATE 1 GRAM PO TAB
1 g | ORAL | 0 refills | Status: DC
Start: 2020-01-28 — End: 2020-01-31
  Administered 2020-01-28 – 2020-01-31 (×10): 1 g via ORAL

## 2020-01-28 MED ORDER — POLYETHYLENE GLYCOL 3350 17 GRAM PO PWPK
1 | Freq: Every day | ORAL | 0 refills | Status: DC | PRN
Start: 2020-01-28 — End: 2020-01-31

## 2020-01-28 MED ORDER — HYDROXYZINE HCL 25 MG PO TAB
25 mg | ORAL | 0 refills | Status: DC | PRN
Start: 2020-01-28 — End: 2020-01-31
  Administered 2020-01-28 – 2020-01-31 (×6): 25 mg via ORAL

## 2020-01-28 MED ORDER — DIPHENHYDRAMINE HCL 50 MG/ML IJ SOLN
50 mg | INTRAMUSCULAR | 0 refills | Status: DC | PRN
Start: 2020-01-28 — End: 2020-01-31
  Administered 2020-01-28: 22:00:00 50 mg via INTRAMUSCULAR

## 2020-01-28 MED ORDER — ACETAMINOPHEN 325 MG PO TAB
650 mg | ORAL | 0 refills | Status: DC | PRN
Start: 2020-01-28 — End: 2020-01-31
  Administered 2020-01-28 – 2020-01-31 (×4): 650 mg via ORAL

## 2020-01-28 MED ORDER — CHOLECALCIFEROL (VITAMIN D3) 25 MCG (1,000 UNIT) PO TAB
1000 [IU] | Freq: Every day | ORAL | 0 refills | Status: DC
Start: 2020-01-28 — End: 2020-01-31
  Administered 2020-01-28 – 2020-01-31 (×4): 1000 [IU] via ORAL

## 2020-01-28 MED ORDER — NICOTINE (POLACRILEX) 4 MG BU GUM
4 mg | BUCCAL | 0 refills | Status: CN | PRN
Start: 2020-01-28 — End: ?

## 2020-01-28 MED ORDER — ARIPIPRAZOLE 10 MG PO TAB
5 mg | Freq: Every day | ORAL | 0 refills | Status: DC
Start: 2020-01-28 — End: 2020-01-28

## 2020-01-28 MED ORDER — CALCIUM CARBONATE 200 MG CALCIUM (500 MG) PO CHEW
500 mg | ORAL | 0 refills | Status: DC | PRN
Start: 2020-01-28 — End: 2020-01-31
  Administered 2020-01-29: 19:00:00 500 mg via ORAL

## 2020-01-28 MED ORDER — HALOPERIDOL 5 MG PO TAB
5 mg | ORAL | 0 refills | Status: DC | PRN
Start: 2020-01-28 — End: 2020-01-31
  Administered 2020-01-30 – 2020-01-31 (×2): 5 mg via ORAL

## 2020-01-28 MED ORDER — HALOPERIDOL LACTATE 5 MG/ML IJ SOLN
5 mg | INTRAMUSCULAR | 0 refills | Status: DC | PRN
Start: 2020-01-28 — End: 2020-01-31

## 2020-01-28 MED ORDER — PANTOPRAZOLE 40 MG PO TBEC
40 mg | Freq: Two times a day (BID) | ORAL | 0 refills | Status: DC
Start: 2020-01-28 — End: 2020-01-31
  Administered 2020-01-29 – 2020-01-31 (×6): 40 mg via ORAL

## 2020-01-28 MED ORDER — MELATONIN 3 MG PO TAB
6 mg | Freq: Every evening | ORAL | 0 refills | Status: DC | PRN
Start: 2020-01-28 — End: 2020-01-31
  Administered 2020-01-29: 01:00:00 6 mg via ORAL

## 2020-01-28 MED ORDER — VENLAFAXINE 37.5 MG PO CP24
37.5 mg | Freq: Every day | ORAL | 0 refills | Status: DC
Start: 2020-01-28 — End: 2020-01-30
  Administered 2020-01-29 – 2020-01-30 (×2): 37.5 mg via ORAL

## 2020-01-28 MED ORDER — FERROUS SULFATE 325 MG (65 MG IRON) PO TAB
325 mg | Freq: Every day | ORAL | 0 refills | Status: DC
Start: 2020-01-28 — End: 2020-01-31
  Administered 2020-01-28 – 2020-01-31 (×4): 325 mg via ORAL

## 2020-01-28 NOTE — Group Note
Name: Alisen Marsiglia        MRN: [id number]          DOB: 08-16-1985          Age: 34 y.o.  Admission Date: 01/28/2020             LOS: 0 days      Group Topic: BH Coping Skills  Group Date: 01/28/2020  Start Time: 1300  End Time: 1400  Facilitators: Prentice Docker      Number of Participants: 7  Group Focus: coping skills, feeling awareness/expression, and self-awareness  Treatment Modality: Recreation Therapy  Interventions utilized were exploration, group exercise, patient education, and support  Purpose: enhance coping skills, express feelings, and increase insight    The recreation therapist provided the group with instructions and expectations to be followed throughout the group. The recreation therapist invited clients to play a game of Spoons, in which they tried to get four of a kind first and grab a spoon before they all ran out. The therapist led the group members in a discussion to process the game and talk about experiencing flow. Patients discussed how flow can benefit [place] and activities that allow them to experience it.            Name: Leonor Darnell Date of Birth: July 13, 1986   MR: [id number]      Level of Participation: patient not present for group     Plan: to continue treatment
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
## 2020-01-28 NOTE — Group Note
Name: [name]        MRN: [id number]          DOB: Jan 19, 1986          Age: 34 y.o.  Admission Date: 01/28/2020             LOS: 0 days      Group Topic: BH Emotion Regulation (DBT Skill)  Group Date: 01/28/2020  Start Time: 0945  End Time: 1030  Facilitators: Prentice Docker      Number of Participants: 9  Group Focus: coping skills, feeling awareness/expression, and self-awareness  Treatment Modality: Recreation Therapy  Interventions utilized were exploration, group exercise, patient education, and support  Purpose: enhance coping skills, express feelings, and increase insight    The recreation therapist provided the group with instructions and expectations to be followed throughout the group. Therapist led group in a discussion about mindfulness and noticing our feelings and surroundings without judgment. Recreation therapist led group members in a game where patients identified the emotion they were feeling at that moment. Patients were then instructed to write blanks on the board for each letter in the emotion they were feeling. Other patients then took turns guessing letters until they were able to guess the emotion. Therapist led group in a discussion about identifying our emotions and using mindfulness to regulate them.               Name: [name] Date of Birth: Jan 19, 1986   MR: [id number]      Level of Participation: patient not present for group     Plan: to continue treatment

## 2020-01-28 NOTE — Progress Notes
Case Manager Initial Assessment    Comments: Patient has a court custody hearing in future regarding her 33 year old son, Jean Rosenthal, whom is currently living with her adoptive parents.    Patient hopes to find an independent living option for herself with support visiting her home.    Patient signed ROI for sister, Corrie Dandy, and [place], Luan Pulling during previous admission.    Patient interested in looking at group homes in [place], [place] that she is familiar with. Group homes are run by [employer]; they are named [place], [place], [place], and [place] [facility].    Patient wants to sign DPOA form.    Provider explains NEMT transportation benefit from Medicaid, as well as explored possibility of sister assisting with medication dispensing. Patient frustrated by BIL's attitude towards her.    ____________________________________________________________________________________________________________________________________________    Reason for admission: Presented tot he ED with increased depression and SI, [place].     Living Situation: Patient living in [place], [place] with sister, Corrie Dandy, and brother-in-law.    Guardian/Involuntary: Own guardian. Voluntary.    Placement upon discharge  Patient's comments: Same as previous. In past, wanted to explore independent living option or group home.    Significant Supports (significant others, parents, adult children, friends, spiritual community):  Name: Renella Cunas  Contact information: [phone]  Relationship: Sister    Name: Daun Peacock  Contact information: [phone]  Relationship: Sister    Name: Rudy Jew  Contact information: [phone]  Relationship: Significant Other    Name: Clovia Cuff information: Unk  Relationship: Brother    Name: Janann Colonel information: Unk  Relationship: Brother    Current Mental Health and Social Services: [facility] [place])  Psychiatrist: [facility]  Therapist: Vickki Hearing (PP in [place], [place])  Case Manager: Marius Ditch [facility])  Other: none    Source of Income   Employment History (within the last year): same as current.   Current Employment: Works for [employer] for past 3 years on part-time basis.   Disability: none   Other: none    Insurance: [place] [employer])    Legal   Probation: none  Parole: none  Court Date: Court hearing in 11/2019 related to custody of 5 y.o. son. Per patient, final order was for her son to remain in custodial care of her adoptive parents.    Immigration status: Citizen    Prior Hospitalizations  Location: [facility]  Dates: 12/08/2019    Location: [facility]  Dates: 09/2018 (left AMA)    Location: [facility]  Dates: Loreli Slot    Location: [facility]  Dates: Loreli Slot    Prior Residential Placements  Location: none     Dates: none
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
## 2020-01-28 NOTE — Consults
Internal Medicine Initial Consult Note      Admission Date: 01/28/2020                                                LOS: 0 days    Reason for Consult: Medical Management     Consult type: Recommendations with Orders     Assessment  Principal Problem:    Suicidal ideation    34 y.o. female admitted to [facility] with the following medical issues:  1. Suicidal ideation with hx of self-harm behaviors.  2. Depression, anxiety, PTSD, bipolar disroder.  3. Psychogenic nonepileptic seizures. Stress induced seizures, 2 (7/14) prior to admission ED. Follows with Dr. Deedra Ehrich [facility]. No medication regimen PTA.   4. Polycystic ovarian syndrome. Last cysts removed May 21 by Dr. Barnett Abu , OBGYN in [place].  Patient reports irregular menstrual cycles and starting Progesterone in July.  5. Stomach ulcer. GERD. Hx gastric bypass 2018. PTA Carafate, PPI.  Follow up with bariatric   7. Iron deficiency. PTA ferrous sulfate  8. Vitamin D deficiency. PTA vitamin D3  9. Vitamin B12 deficiency. IM B12 with PCP.   10. Migraines.   11. Costochondritis. No acute issues. PRN tylenol.   12. History tobacco use. Former 1ppd x 15 years. Reports quit smoking June, 2021.     Recommendations:  - Continue PTA Carafate and PPI per bariatric surgery, telehealth follow up with Dr. Velva Harman.   - Patient should continue to follow up with OBGYN at discharge.   - Ensure hydration, bowel regimen if needed  - DVT prophylaxis if non-ambulatory  - Records from previous encounters, where available, have been reviewed.   - Radiology, imaging, labs, where available, were reviewed  - Continued admission to inpatient psych. Will defer to primary team on psychiatric treatment and pain management    The patient is admitted to inpatient psychiatric unit.  The inpatient psychiatric team is following.  Internal medicine team was consulted for medical evaluation.  I reviewed patient medical history, vitals, medications, labs and available medical records  .  Thanks for the consult , the patient is currently medically stable we will be happy to see the patient again if needed     Internal medicine can be reached out by Looking up [facility] on Voalte or Cureat[employer] app for direct app communication    or by paging [phone]      Jerold Coombe) Jettie Booze, APRN-NP  Pager [phone]   @ Voalte   _______________________  _______________________________________________    History of Present Illness: [name] is a 34 y.o. female admitted to [facility] for suicidal ideation.  She has PHM of bipolar disorder, depression, and anxiety, PTSD, GERD, stomach ulcer, psychogenic seizure disorder, asthma, pancreatitis, migraines, PCOS, costochondritis, vitamin deficiency, and history of gastric bypass (01/2018).  She reported to [facility] 7/14 with [place] ideations of [place], increased depression and anxiety. She reports 2 psychogenic seizures the same day with unconsciousness.  She was brought to the ED by her sister who the patient reports is her Power of [place]. Patient was recently inpatient at [facility] and was discharged 5/29. Patient reports wanting to return to [facility] to get her medications worked out.  Patient endorses feeling of anxiety with chest tightness at times with intermittent tenderness around sternum.  She endorses mild headache without migraine symptoms.  Endorses stomach cramping and tenderness at baseline.  Denies nausea, vomiting, or acid reflux.  Denies shortness of breath, cough, or chest pain. Denies constipation, diarrhea, or urinary symptoms.  Denies numbness, tingling, rashes, or wounds.     Medical History:   Diagnosis Date   ? ADHD (attention deficit hyperactivity disorder)    ? Anxiety disorder    ? Borderline personality disorder (HCC)    ? Depression    ? Gastrointestinal disorder     GERD   ? Heart murmur    ? MRSA (methicillin resistant Staphylococcus aureus) ~2014    right great toenail, (1st negative 01/07/18 [place])   ? Other nonspecific abnormal finding    ? PCOS (polycystic ovarian syndrome)    ? Pre-diabetes    ? PTSD (post-traumatic stress disorder)    ? Seizure (HCC)    ? Tachycardia 2008    r/t anxiety/panic attack   ? Unspecified cardiovascular disease    ? Unspecified mental or behavioral problem      Surgical History:   Procedure Laterality Date   ? HX CHOLECYSTECTOMY  11/2016    self reported   ? 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 ESOPHAGOGASTRODUODENOSCOPY WITH SPECIMEN COLLECTION BY BRUSHING/ WASHING N/A 01/20/2018    Performed by Bufford Lope, MD at [facility]   ? ESOPHAGOGASTRODUODENOSCOPY WITH DILATION N/A 02/24/2018    Performed by Bufford Lope, MD at [facility]   ? ESOPHAGOGASTRODUODENOSCOPY WITH SPECIMEN COLLECTION BY BRUSHING/ WASHING N/A 03/31/2018    Performed by Bufford Lope, MD at [facility]   ? DILATION AND CURETTAGE     ? OVARIAN CYST REMOVAL     ? UPPER GASTROINTESTINAL ENDOSCOPY       Social History     Socioeconomic History   ? Marital status: Married     Spouse name: Not on file   ? Number of children: Not on file   ? Years of education: Not on file   ? Highest education level: Not on file   Occupational History   ? Not on file   Tobacco Use   ? Smoking status: Former Smoker     Packs/day: 1.00     Years: 15.00     Pack years: 15.00     Types: Cigarettes     Quit date: 12/29/2019     Years since quitting: 0.0   ? Smokeless tobacco: Never Used   Substance and Sexual Activity   ? Alcohol use: No   ? Drug use: No   ? Sexual activity: Not on file   Other Topics Concern   ? Not on file   Social History Narrative   ? Not on file     Social Determinants of Health     Financial Resource Strain:    ? Difficulty of Paying Living Expenses:    Food Insecurity:    ? Worried About Programme researcher, broadcasting/film/video in the Last Year: ? Barista in the Last Year:    Transportation Needs:    ? Freight forwarder (Medical):    ? Lack of Transportation (Non-Medical):    Physical Activity:    ? Days of Exercise per Week:    ? Minutes of Exercise per Session:    Stress:    ? Feeling  of Stress :    Social Connections:    ? Frequency of Communication with Friends and Family:    ? Frequency of Social Gatherings with Friends and Family:    ? Attends Religious Services:    ? Active Member of Clubs or Organizations:    ? Attends Banker Meetings:    ? Marital Status:    Intimate Partner Violence:    ? Fear of Current or Ex-Partner:    ? Emotionally Abused:    ? Physically Abused:    ? Sexually Abused:      Family History   Problem Relation Age of Onset   ? Hypertension Mother    ? Diabetes Mother    ? Diabetes Father    ? Epilepsy Father    ? Epilepsy Sister      Allergies:  Mushroom, Asa [aspirin], Coconut, Nsaids (non-steroidal anti-inflammatory drug), Sulfa (sulfonamide antibiotics), and Yellow dye    Scheduled Meds:[Held by Provider] ARIPiprazole (ABILIFY) tablet 5 mg, 5 mg, Oral, QDAY  cholecalciferol (VITAMIN D-3) tablet 1,000 Units, 1,000 Units, Oral, QDAY  ferrous sulfate (FEOSOL) tablet 325 mg, 325 mg, Oral, QDAY  pantoprazole DR (PROTONIX) tablet 40 mg, 40 mg, Oral, QDAY(21)  sucralfate (CARAFATE) tablet 1 g, 1 g, Oral, Q6H    Continuous Infusions:  PRN and Respiratory Meds:acetaminophen Q6H PRN, calcium carbonate Q2H PRN, diphenhydrAMINE Q6H PRN, haloperidoL Q6H PRN, haloperidol lactate Q6H PRN, hydrOXYzine Q6H PRN, melatonin QHS PRN, polyethylene glycol 3350 QDAY PRN    Review of Systems:  A 14 point review of systems was negative except for: headache, chest tightness with anxiety, stomach cramping      Vital Signs:  Last Filed in 24 hours Vital Signs:  24 hour Range    BP: 119/68 (07/15 0905)  Temp: 36.7 ?C (98 ?F) (07/15 0905)  Pulse: 80 (07/15 0905)  Respirations: 16 PER MINUTE (07/15 0905)  SpO2: 100 % (07/15 0905) Height: 170.2 cm (67) (07/15 0905) BP: (119)/(68)   Temp:  [36.7 ?C (98 ?F)]   Pulse:  [80]   Respirations:  [16 PER MINUTE]   SpO2:  [100 %]      Physical Exam:  Gen: alert and oriented, cooperative, no distress, depressed mood, anxious  Head: Normocephalic, without obvious abnormality, atraumatic   Eyes: conjunctivae/corneas clear. PERRL  Throat: MMMl. Teeth and gums normal   Neck: supple, symmetrical, trachea midline, no adenopathy  Lungs: clear to auscultation bilaterally, no wheezes, rales, rhonchi   Heart: regular rate and rhythm, S1, S2 normal, no murmur, click, rub or gallop   Abdomen: soft, tender to palpation. Bowel sounds normal, non-distended   Extremities: pulses palpable, no pedal edema or skin lesions, loose skin BUE, BLE   Neurologic: Grossly normal    Lab Review  Pertinent labs reviewed (7/14) from OSH  Hgb 11.1  HCT 33.7  WBC 5.8  PLT 375    NA 145  K 3.5  CL 110  CO2 27  BUN 11  CRT 0.6    UA with trace leukocytes, occasional WBC, moderate squamous epithelial, slight mucous.  HCG Negative     UDS Negative      Point of Care Testing  (Last 24 hours)       Radiology and other Diagnostics Review:    EKG Reviewed   NSR 72, no ST elevation   Compared to previous ECG 5/21    I personally saw and examined patient.    Marcene Brawn, APRN-NP  Available on [place]  or Cureatr app under [employer] [facility]    pager [phone]
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
## 2020-01-28 NOTE — Progress Notes
PSYCHIATRIC NURSING ADMISSION ASSESSMENT     NAME:[name]             MRN: [id number]             DOB:May 17, 1986          AGE: 34 y.o.  ADMISSION DATE: 01/28/2020             DAYS ADMITTED: LOS: 0 days            Mental Status Exam  Legal Status: Voluntary Admission  General Appearance: Normal  Mood / Affect: Anxious  Speech: Normal  Content Of Thought: Suicidal Thoughts  Motor Activity: Normal  Flow Of Thought: Normal  Insight / Judgment: Fair Judgment, Fair Insight  Behavior: Normal  Patient Strengths: Motivation and readiness for change, Interpersonal relationships and supports, i.e. family, friends, peers    [name] reports anxiety and depression as a 8/10.  She received a dose of Atarax in intake. She stated she was having chest tightness as well in intake.  She said her appetite was decreased d/t anxiety and reports weight loss.  She had gastric bypass in 2019. She reports nausea and vomiting yesterday.    She states poor sleep.   Oriented to unit.    1450-Pt stated my stomach hurts. I had flashbacks while I was sleeping. They were night terrors. When asked what would help she said, calling my sister. She called her sister as now free time.    1546-Pt received Atarax PRN. She was c/o anxiety and had already removed herself from group.  She was talking to staff.  She went to her room to lay down. Atarax administered.  Pt stated she has to take Carafate on an empty stomach. Shewants to take it at HS.    1724-pt ate mushrooms in her rice tonight for dinner.  She came to this nurse and stated her throat was itchy.  IM Benadryl was administered in R deltoid.   1755-Pt resting in bed with eyes closed. She said she is feeling better.    Will continue to monitor on q 15 min safety checks.      Vicente Males, RN  01/28/2020

## 2020-01-28 NOTE — Care Plan
Problem: Discharge Planning  Goal: Participation in plan of care  Outcome: Goal Ongoing  Flowsheets (Taken 01/28/2020 1039)  Participation in Plan of Care: Involve patient/caregiver in care planning decision making  Goal: Knowledge regarding plan of care  Outcome: Goal Ongoing  Flowsheets (Taken 01/28/2020 1039)  Knowledge regarding plan of care:   Provide medication management education   Provide admission education to parent/caregiver  Goal: Prepared for discharge  Outcome: Goal Ongoing  Flowsheets (Taken 01/28/2020 1039)  Prepared for discharge: Complete ADL ability assessment     Problem: Moderate Fall Risk  Goal: Moderate Fall Risk  Outcome: Goal Ongoing  Flowsheets (Taken 01/28/2020 1039)  Moderate Fall Risk: Move patient near RN station if confused (if possible)     Problem: Thought Process - Altered  Goal: Demonstration of organized thought processes  Outcome: Goal Ongoing  Flowsheets (Taken 01/28/2020 1039)  Demonstration of organized thought processes:   Assess disturbed thought process signs and symptoms   Assess cognitive ability   Assess thought process     Problem: Coping - Ineffective, Family  Goal: Knowledge of positive coping methods  Outcome: Goal Ongoing  Flowsheets (Taken 01/28/2020 1039)  Knowledge of positive coping methods:   Provide disease process education   Provide positive coping methods education   Provide family interaction skills education

## 2020-01-28 NOTE — H&P (View-Only)
PSYCHIATRY H&P  Name: [name]        MRN: [id number]          DOB: 1985-08-07          Age: 34 y.o.  Admission Date: 01/28/2020             LOS: 0 days      Service: [facility]                                             PCP  Caffie Damme    ASSESSMENT & DIAGNOSIS  Principal Problem:    Severe episode of recurrent major depressive disorder, without psychotic features (HCC)  Active Problems:    Chronic post-traumatic stress disorder (PTSD)    GAD (generalized anxiety disorder)    Borderline personality disorder (HCC)    Suicidal ideation      PLAN  1. Admit to [facility]  2. Internal Medicine Consult for Medical Evaluation and Co-Management of conditions as needed  3. Patient will undergo a medication evaluation and adjustment as necessary.  4. Participate in a program schedule including group and individual therapy.  5. The patient will be in a structured environment to maintain safety  6. Will obtain collateral information as needed.  7. Continue Abilify maintenna.  Her last dose was last week.  8. Start Venlafaxine XR 37.5 mg daily for depression and PTSD.  9. Continue all other medications.    Discharge Plan  Patient will exhibit stability of their symptoms. Will work a Paramedic to develop a Water engineer. Estimated length of stay is 5 days.    ______________________________________________________________  CHIEF CONCERN  The patient was brought to the hospital for ?suicidal ideations.?    Sources of Information  Patient, Chart Review, Intake Assessment    HISTORY OF PRESENT ILLNESS  [name] is a 34 y.o. female with a history of depression, PTSD, GAD, and borderline personality disorder.    She was brought to the hospital by her sister yesterday after [place] her car down to the river with the intention of driving in to the water in a suicide attempt.  She has been struggling with worsening mood and irritability despite the Abilify maintenna.  She has been having increased self harm thoughts for the last few weeks.  Then she reports going off on her family members prior to developing intense suicidal thoughts with plan to drive her car into the river.  She denies other suicide attempts since her last admission.  Denies other self harm.  She would like to restart Venlafaxine which she found helpful in the past as well as reports that her sister takes it with benefit.  Reports ongoing SI.  Denies HI/AH/VH.        Environmental Risks       PAST PSYCHIATRIC HISTORY  Currently follows at [facility]  Hospitalizations- multiple prior hospitalizations including [place] in May 2021  Attempts- several suicide attempts.  Reports her last was yesterday when she drove her car to the river again.  Medication trials- Abilify maintenna currently with multiple other medications over the years with variable benefit  Current meds-   Abilify maintenna    FAMILY PSYCHIATRIC HISTORY  Dad- bipolar  Mom-Mental retardation and depression  Sister-depression, anxiety  Cousin hung himself     SUBSTANCE USE  Quit  smoking.  Denies alcohol or drug use.  Social History     Substance and Sexual Activity   Drug Use No       Abuse History      SOCIAL HISTORY  Lives at home with her sister, brother in law, and 2 other roommates.  She works part time with [employer].      Social History     Tobacco Use   ? Smoking status: Former Smoker     Packs/day: 1.00     Years: 15.00     Pack years: 15.00     Types: Cigarettes     Quit date: 12/29/2019     Years since quitting: 0.0   ? Smokeless tobacco: Never Used   Substance Use Topics   ? Alcohol use: No   ? Drug use: No         FAMILY MEDICAL HISTORY  Family History   Problem Relation Age of Onset   ? Hypertension Mother    ? Diabetes Mother    ? Diabetes Father    ? Epilepsy Father    ? Epilepsy Sister        PAST MEDICAL AND SURGICAL HISTORY  Medical History:   Diagnosis Date   ? ADHD (attention deficit hyperactivity disorder)    ? Anxiety disorder    ? Borderline personality disorder (HCC)    ? Depression    ? Gastrointestinal disorder     GERD   ? Heart murmur    ? MRSA (methicillin resistant Staphylococcus aureus) ~2014    right great toenail, (1st negative 01/07/18 [place])   ? Other nonspecific abnormal finding    ? PCOS (polycystic ovarian syndrome)    ? Pre-diabetes    ? PTSD (post-traumatic stress disorder)    ? Seizure (HCC)    ? Tachycardia 2008    r/t anxiety/panic attack   ? Unspecified cardiovascular disease    ? Unspecified mental or behavioral problem      Surgical History:   Procedure Laterality Date   ? HX CHOLECYSTECTOMY  11/2016    self reported   ? 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 ESOPHAGOGASTRODUODENOSCOPY WITH SPECIMEN COLLECTION BY BRUSHING/ WASHING N/A 01/20/2018    Performed by Bufford Lope, MD at [facility]   ? ESOPHAGOGASTRODUODENOSCOPY WITH DILATION N/A 02/24/2018    Performed by Bufford Lope, MD at [facility]   ? ESOPHAGOGASTRODUODENOSCOPY WITH SPECIMEN COLLECTION BY BRUSHING/ WASHING N/A 03/31/2018    Performed by Bufford Lope, MD at [facility]   ? DILATION AND CURETTAGE     ? OVARIAN CYST REMOVAL     ? UPPER GASTROINTESTINAL ENDOSCOPY         Home Medications  Medications Prior to Admission   Medication Sig Dispense Refill Last Dose   ? ARIPiprazole (ABILIFY MAINTENA) 400 mg injectable VIAL Inject four hundred mg into the muscle every 28 days. Received on 12/09/19; oral overlap to stop 12/23/19; next injection due 01/06/20 1 each 0 Past Week   ? cholecalciferol (VITAMIN D-3) 1,000 units tablet Take 1,000 Units by mouth daily.   01/27/2020   ? cyclobenzaprine (FLEXERIL) 5 mg tablet Take 5-10 mg by mouth three times daily as needed for Muscle Cramps.   Past Week   ? ferrous sulfate (IRON PO) Take  by mouth.   01/27/2020   ?  lansoprazole DR (PREVACID) 30 mg capsule Take one capsule by mouth twice daily. Indications: marginal ulcer 84 capsule 0 01/27/2020   ? sucralfate (CARAFATE) 1 gram tablet Take one tablet by mouth every 6 hours. Take on an empty stomach.  Indications: marginal ulcer 56 tablet 0 01/27/2020       Hospital Medications  Scheduled Meds:[Held by Provider] ARIPiprazole (ABILIFY) tablet 5 mg, 5 mg, Oral, QDAY  cholecalciferol (VITAMIN D-3) tablet 1,000 Units, 1,000 Units, Oral, QDAY  ferrous sulfate (FEOSOL) tablet 325 mg, 325 mg, Oral, QDAY  pantoprazole DR (PROTONIX) tablet 40 mg, 40 mg, Oral, QDAY(21)  sucralfate (CARAFATE) tablet 1 g, 1 g, Oral, Q6H    Continuous Infusions:  PRN and Respiratory Meds:acetaminophen Q6H PRN, calcium carbonate Q2H PRN, diphenhydrAMINE Q6H PRN, haloperidoL Q6H PRN, haloperidol lactate Q6H PRN, hydrOXYzine Q6H PRN, melatonin QHS PRN, polyethylene glycol 3350 QDAY PRN      Allergies  Mushroom, Asa [aspirin], Coconut, Nsaids (non-steroidal anti-inflammatory drug), Sulfa (sulfonamide antibiotics), and Yellow dye    REVIEW OF SYSTEMS  Review of Systems   Constitutional: Positive for activity change, appetite change and fatigue.   Psychiatric/Behavioral: Positive for decreased concentration, dysphoric mood, sleep disturbance and suicidal ideas. Negative for agitation, behavioral problems, confusion, hallucinations and self-injury. The patient is nervous/anxious. The patient is not hyperactive.    All other systems reviewed and are negative.      ______________________________________________________________  OBJECTIVE     Vital Signs: Last Filed In 24 Hours                Vital Signs: 24 Hour Range   BP: 119/68 (07/15 0905)  Temp: 36.7 ?C (98 ?F) (07/15 0905)  Pulse: 80 (07/15 0905)  Respirations: 16 PER MINUTE (07/15 0905)  SpO2: 100 % (07/15 0905)  Height: 170.2 cm (67) (07/15 0905) BP: (119)/(68)   Temp:  [36.7 ?C (98 ?F)]   Pulse:  [80]   Respirations:  [16 PER MINUTE]   SpO2:  [100 %]    Intensity Pain Scale (Self Report): (not recorded)    Height: 170.2 cm (67)  Weight: 76.7 kg (169 lb 1.6 oz)      MENTAL STATUS EXAMINATION    ? General/Constitutional: 34 y/o white female, appears stated age, no acute distress, average build, dressed appropriately, in good grooming/hygiene  ? Behavior: Calm, Cooperative  ? Eye Contact: Appropriate and maintained  ? Speech: Normal rate, rhythm, tone and volume  ? Thought Process: Linear and goal directed  ? Thought Content: Reports Suicidal Thoughts. Denied Homicidal Thoughts. Denied Self Harm. No delusions or paranoia noted.  ? Perception: Denied Auditory Hallucinations and denied Visual Hallucinations. No illusions noted  ? Associations: Intact and appropriate for age  ? Mood: not good  ? Affect: Mood Congruent, constricted  ? Insight/Judgement: fair/fair    ? Orientation: Alert and Oriented to person, place, time and situation  ? Recent and remote memory: Grossly Intact  ? Attention span and concentration: Good  ? Language: Fluent, Spontaneous  ? Fund of knowledge and vocabulary: Appears Average  ? Motor: No tics or tremors noted. Moves extremities without issue while laying in the hospital bed.      Lab/Radiology/Other Diagnostic Tests:  No visits with results within 30 Day(s) from this visit.   Latest known visit with results is:   Admission on 12/08/2019, Discharged on 12/11/2019   Component Date Value   ? Glucose, POC 12/09/2019 101*   ? Cholesterol 12/10/2019 134    ? Triglycerides 12/10/2019 70    ?  HDL 12/10/2019 50    ? LDL 12/10/2019 80    ? VLDL 12/10/2019 14    ? Non HDL Cholesterol 12/10/2019 84    ? Hemoglobin A1C 12/10/2019 5.1    ? Chlamydia Trachomatis Pr* 12/11/2019 NEG    ? Neisseria Gonorroeae PCR 12/11/2019 NEG    ? UA Reflex Culture 12/11/2019                      Value:Criteria for reflex to culture are WBC>10, Positive Nitrite, and/or >=+1   leukocytes. If quantity is not sufficient, an addendum will follow.     ? Color,UA 12/11/2019 YELLOW    ? Turbidity,UA 12/11/2019 CLEAR    ? Specific Gravity-Urine 12/11/2019 1.013    ? pH,UA 12/11/2019 6.0    ? Protein,UA 12/11/2019 NEG    ? Glucose,UA 12/11/2019 NEG    ? Marguerite Olea 12/11/2019 NEG    ? Bilirubin,UA 12/11/2019 NEG    ? Blood,UA 12/11/2019 NEG    ? Urobilinogen,UA 12/11/2019 NORMAL    ? Nitrite,UA 12/11/2019 NEG    ? Leukocytes,UA 12/11/2019 1+*   ? Urine Ascorbic Acid, UA 12/11/2019 NEG    ? WBCs,UA 12/11/2019 0-2    ? RBCs,UA 12/11/2019 0-2    ? Comment,UA 12/11/2019                      Value:Criteria for reflex to culture are WBC>10, Positive Nitrite, and/or >=+1   leukocytes. If quantity is not sufficient, an addendum will follow.     ? MucousUA 12/11/2019 TRACE    ? Squamous Epithelial Cells 12/11/2019 0-2    ? Battery Name 12/11/2019 URINE CULTURE    ? Report Status 12/11/2019 FINAL 12/12/2019    ? Specimen Description 12/11/2019 URINE MIDSTREAM      ? Special Requests 12/11/2019 NONE    ? Culture 12/11/2019                      Value:<10,000 CFU/ml  Urogenital and/or skin microbiota       ______________________________________________________________  Rutherford Limerick, DO

## 2020-01-28 NOTE — Other
Safety Plan   Name: [name]          MRN: [id number]              DOB: 24-Feb-1986        Age: 34 y.o.  Admission Date: 01/28/2020             Step 1: WHAT I LOOK FORWARD TO IN MY FUTURE are:  Growing up with my son  (future accomplishments)      Step 2: What are my protective factors? (loved ones, pets, things you look forward to, faith, job, supportive people, fear of death)   Son    Step 3: What are TRIGGERS that I can avoid that may result in thoughts of harm to myself or others? (situations, people, behaviors, places, substance)  1. Negative people  2. People not wanting to understand me  3.     Step 4: What are my internal WARNING SIGNS of distress? (thoughts, images, moods, memories)  1. Wanting to self harm  2. Increased depression  3.     Step 5: INTERNAL COPING STRATEGIES ? What can I do on my own to help prevent myself from acting out on my thoughts to harm myself or others?     1. Talking with safe people  2. Deep breathing  3. Listening to music     Step 6: How can I attempt to have safer surroundings in my environment to prevent a crisis?  1. Follow up with the guidance Center  2. Compliant with medicaitons  3. Utilize coping skills       NATIONAL SUICIDE PREVENTION [employer]IF[employer]: 1-800-SUICIDE (1.773-176-8204 or 1.670-885-1340 for deaf & hard of hearing available 24 hours/7days/week) Or text (825)255-3293  If a crisis develops, and/or I want to harm myself or others, and using the above safety plan is NOT EFFECTIVE, I agree to immediately do the following:     1. Either remove myself from the presence of weapons, medications, or other means of harming myself, or ask a trusted friend/family member to remove such things from my home.   2. Call 911 or go the nearest hospital emergency room.    Additional Contacts:   [phone] available 24 hours a day, seven days a week)  o TrevorChat online chat: [email or web address].[email or web address] (Available 7 days a week (3:00 p.m. - 9:00 p.m. ET / 12:00 p.m. - 6:00 p.m. PT) Text the word Beryle Beams to [phone] (Available on Fridays (4:00 p.m. - 8:00 p.m. ET / 1:00 p.m. - 5:00 p.m. PT)   o [employer] National Helpline - 1-800-662-HELP (367)218-5933)  o The Disaster Distress Helpline 606-733-3812 or text TalkWithUs to 647-629-2202  o Sexual assault hotline: 1.(321) 224-3323 available 24/7  o Domestic Violence hotline: 1-800-799-SAFE 332-337-9200) available 24/7.

## 2020-01-29 MED ORDER — QUETIAPINE 25 MG PO TAB
50 mg | Freq: Every evening | ORAL | 0 refills | Status: DC
Start: 2020-01-29 — End: 2020-01-31
  Administered 2020-01-30 – 2020-01-31 (×2): 50 mg via ORAL

## 2020-01-29 NOTE — Progress Notes
Alert O x3, doesn't know the President's name ,  She is medication compliant and out in mileu interactive with peers and staff, telling the story of how she ate mushrooms at dinner and had to have a shot of benadry. She denies hiip/avh but acknowledges SI and agrees with safety plan. She rates her anxiety 6/10 and her depression 7/10, eye contact is good.     PRN Medication Administered  Melatonin   Non pharmacological interventions attempted prior to PRN medication administration:  Decrease stimuli    Brief narrative of reason for PRN medication administration:  Patient request for sleep.

## 2020-01-29 NOTE — Care Plan
Problem: Harm to self, high risk of suicide  Goal: Absence of Harm to Self  Outcome: Goal Ongoing  Flowsheets (Taken 01/28/2020 2213)  Absence of harm to self:   Assess and report significant changes in mood and affect   Maintain a safe environment     Problem: Health Maintenance - Impaired  Goal: Establish therapeutic relationships  Outcome: Goal Ongoing  Flowsheets (Taken 01/28/2020 2213)  Establish therapeutic relationships:   Encourage expressions of feelings   Assist patient in setting realistic expectations   Involve patient in decision-making process     Problem: Mood - Altered  Goal: Stabilize mood  Outcome: Goal Ongoing  Flowsheets (Taken 01/28/2020 2213)  Stabilize mood:   Assess coping style   Assess depressive symptoms   Establish therapeutic relationships   Assess depression history   Assess ineffective coping signs and symptoms

## 2020-01-30 MED ORDER — VENLAFAXINE 75 MG PO CP24
75 mg | Freq: Every day | ORAL | 0 refills | Status: DC
Start: 2020-01-30 — End: 2020-01-31
  Administered 2020-01-31: 13:00:00 75 mg via ORAL

## 2020-01-31 ENCOUNTER — Encounter: Admit: 2020-01-31 | Discharge: 2020-01-31

## 2020-01-31 MED ORDER — MELATONIN 3 MG PO TAB
6 mg | ORAL_TABLET | Freq: Every evening | ORAL | 0 refills | 28.00000 days | Status: AC | PRN
Start: 2020-01-31 — End: ?

## 2020-01-31 MED ORDER — VENLAFAXINE 75 MG PO CP24
75 mg | ORAL_CAPSULE | Freq: Every day | ORAL | 0 refills | Status: DC
Start: 2020-01-31 — End: 2020-01-31
  Filled 2020-01-31: qty 30, 30d supply, fill #1

## 2020-01-31 MED ORDER — VENLAFAXINE 75 MG PO CP24
75 mg | ORAL_CAPSULE | Freq: Every day | ORAL | 0 refills | Status: AC
Start: 2020-01-31 — End: ?

## 2020-01-31 MED ORDER — QUETIAPINE 50 MG PO TAB
50 mg | ORAL_TABLET | Freq: Every evening | ORAL | 0 refills | Status: AC
Start: 2020-01-31 — End: ?
  Filled 2020-01-31: qty 30, 30d supply, fill #1

## 2020-01-31 MED ORDER — ABILIFY MAINTENA 400 MG IM SERR
400 mg | INTRAMUSCULAR | 0 refills | Status: AC
Start: 2020-01-31 — End: ?

## 2020-01-31 MED ORDER — MELATONIN 3 MG PO TAB
6 mg | ORAL_TABLET | Freq: Every evening | ORAL | 0 refills | 28.00000 days | Status: DC | PRN
Start: 2020-01-31 — End: 2020-01-31

## 2020-02-09 ENCOUNTER — Ambulatory Visit: Admit: 2020-02-09 | Discharge: 2020-02-09 | Payer: MEDICAID

## 2020-02-09 ENCOUNTER — Encounter: Admit: 2020-02-09 | Discharge: 2020-02-09

## 2020-02-09 DIAGNOSIS — IMO0002 Unspecified mental or behavioral problem: Secondary | ICD-10-CM

## 2020-02-09 DIAGNOSIS — Z720 Tobacco use: Secondary | ICD-10-CM

## 2020-02-09 DIAGNOSIS — A4902 Methicillin resistant Staphylococcus aureus infection, unspecified site: Secondary | ICD-10-CM

## 2020-02-09 DIAGNOSIS — R569 Unspecified convulsions: Secondary | ICD-10-CM

## 2020-02-09 DIAGNOSIS — R7303 Prediabetes: Secondary | ICD-10-CM

## 2020-02-09 DIAGNOSIS — F419 Anxiety disorder, unspecified: Secondary | ICD-10-CM

## 2020-02-09 DIAGNOSIS — F603 Borderline personality disorder: Secondary | ICD-10-CM

## 2020-02-09 DIAGNOSIS — K929 Disease of digestive system, unspecified: Secondary | ICD-10-CM

## 2020-02-09 DIAGNOSIS — E282 Polycystic ovarian syndrome: Secondary | ICD-10-CM

## 2020-02-09 DIAGNOSIS — I2510 Atherosclerotic heart disease of native coronary artery without angina pectoris: Secondary | ICD-10-CM

## 2020-02-09 DIAGNOSIS — F4310 Post-traumatic stress disorder, unspecified: Secondary | ICD-10-CM

## 2020-02-09 DIAGNOSIS — R6889 Other general symptoms and signs: Secondary | ICD-10-CM

## 2020-02-09 DIAGNOSIS — R000 Tachycardia, unspecified: Secondary | ICD-10-CM

## 2020-02-09 DIAGNOSIS — F329 Major depressive disorder, single episode, unspecified: Secondary | ICD-10-CM

## 2020-02-09 DIAGNOSIS — F909 Attention-deficit hyperactivity disorder, unspecified type: Secondary | ICD-10-CM

## 2020-02-09 DIAGNOSIS — R011 Cardiac murmur, unspecified: Secondary | ICD-10-CM

## 2020-02-11 ENCOUNTER — Encounter: Admit: 2020-02-11 | Discharge: 2020-02-11

## 2020-02-11 NOTE — Telephone Encounter
Confirmation this is the pt the call is intended for (name/DOB): Yes    Any questions about DC instructions? No    Did you have any difficulty filling Rx's? No    Any questions about follow-up appointments? No    Name of case manager to follow up with: N/A    Did you receive all items at discharge that you came in with?: Yes    Any other questions/comments: Stated she's doing much better than she was before.

## 2020-02-15 ENCOUNTER — Encounter: Admit: 2020-02-15 | Discharge: 2020-02-15

## 2020-02-15 NOTE — Progress Notes
letter and pics sent to billing  cosmetic quotes sent to pt

## 2020-02-18 IMAGING — CT STONE PROTOCOL(Adult)
2 of 3 series · 11 of 46 positions shown, 12 images · non-contrast
Comparison: none

[Series 2: abdomen ax 3.00 br40 s3 · axial · 0.54mm/px · z∈[+1375,+1819]mm · 8 of 172 slices shown, 9 images]
[im 12/172  soft-tissue]
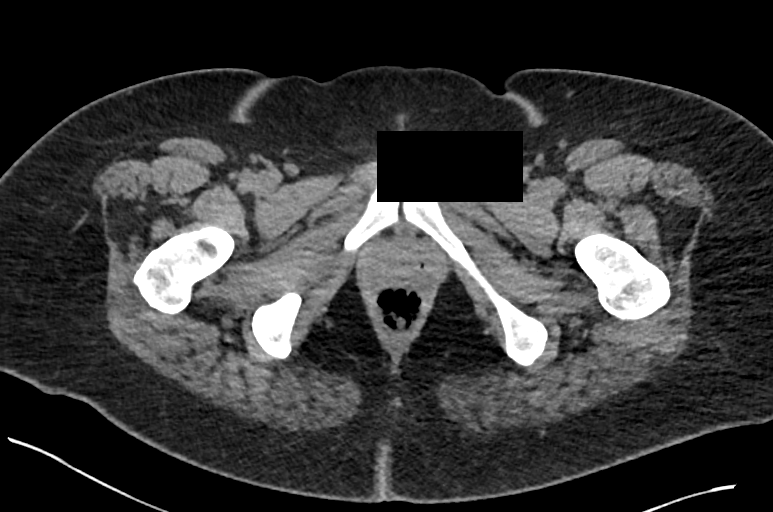
[im 12/172  bone]
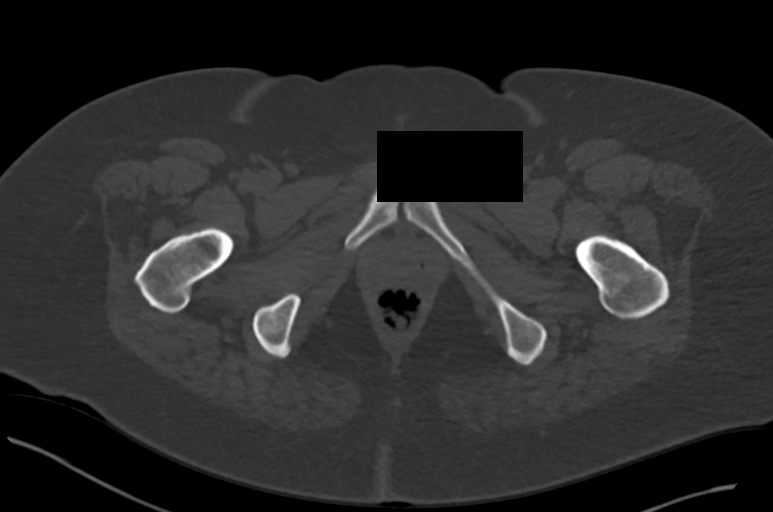
[im 34/172  soft-tissue]
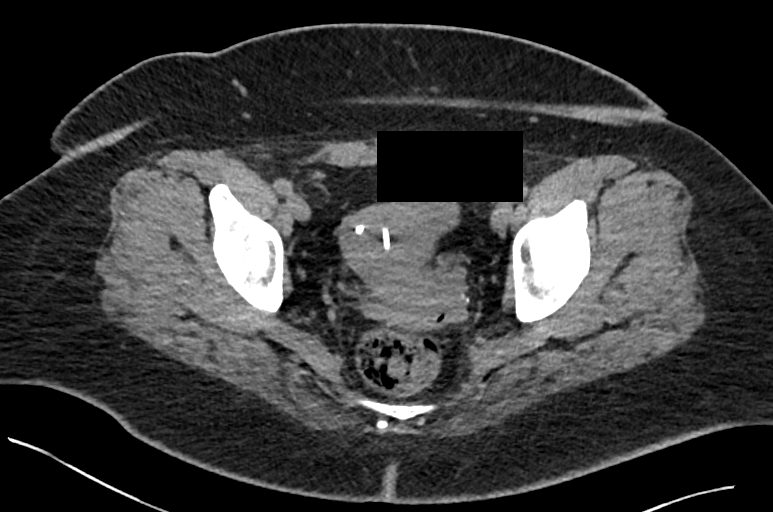
[im 56/172  soft-tissue]
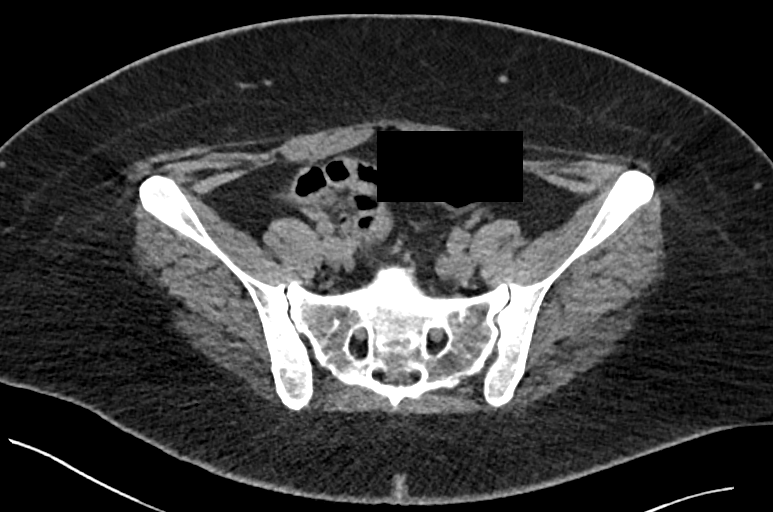
[im 78/172  soft-tissue]
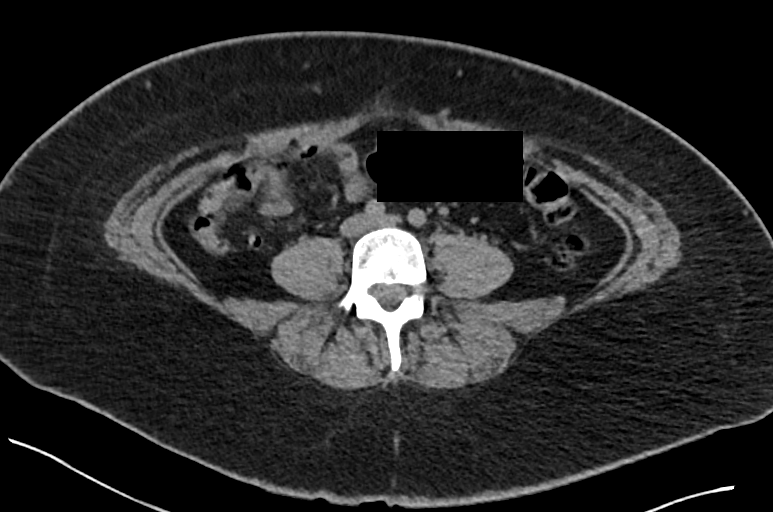
[im 94/172  soft-tissue]
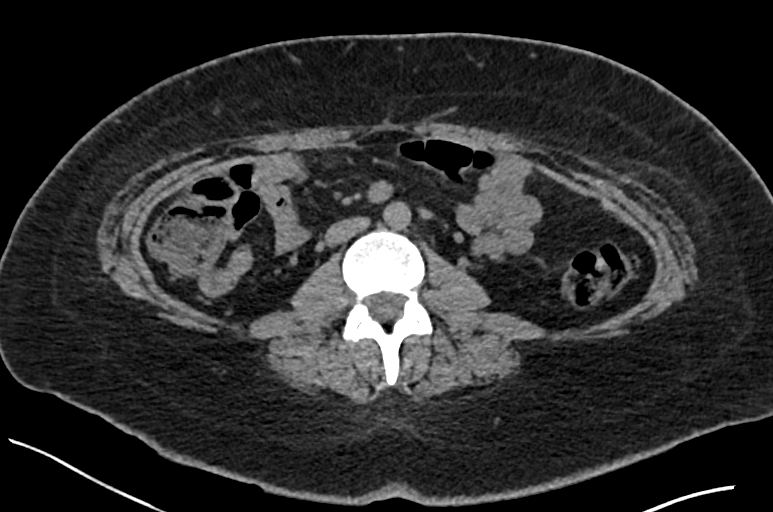
[im 116/172  soft-tissue]
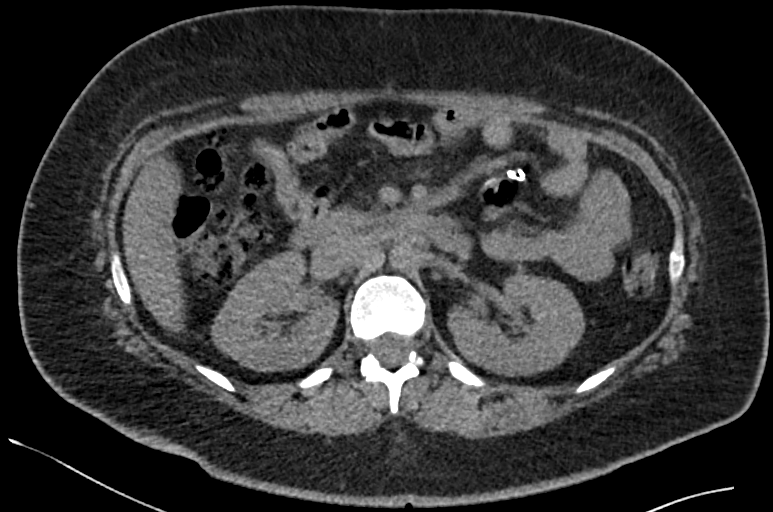
[im 138/172  soft-tissue]
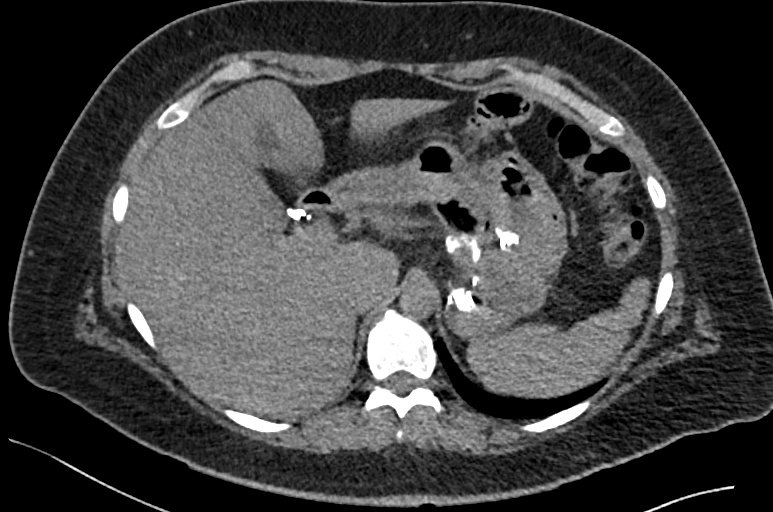
[im 160/172  soft-tissue]
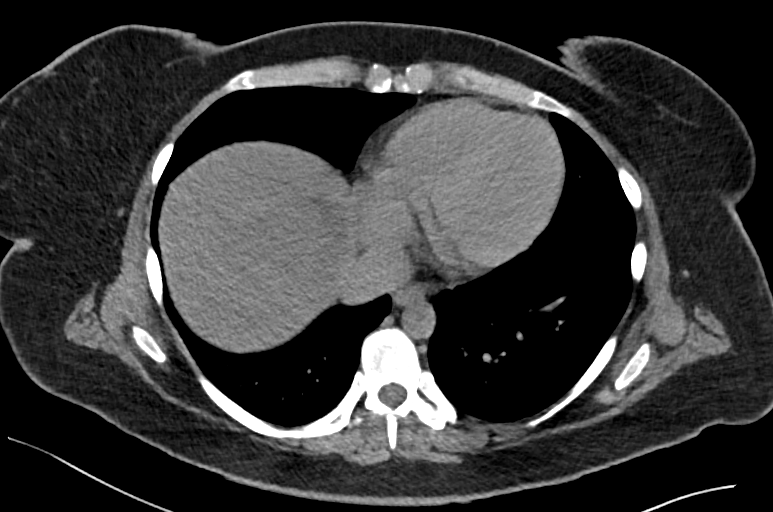

[Series 4: abdomen cor 3.00 br40 s3 · coronal · 0.82mm/px · 3 of 92 slices shown]
[im 31/92  soft-tissue]
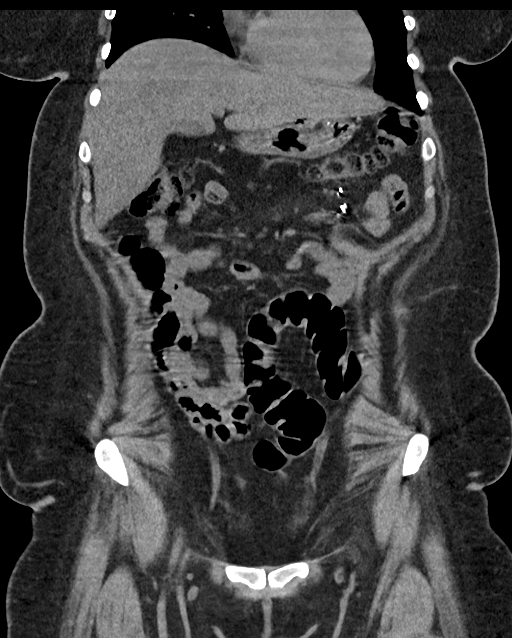
[im 41/92  soft-tissue]
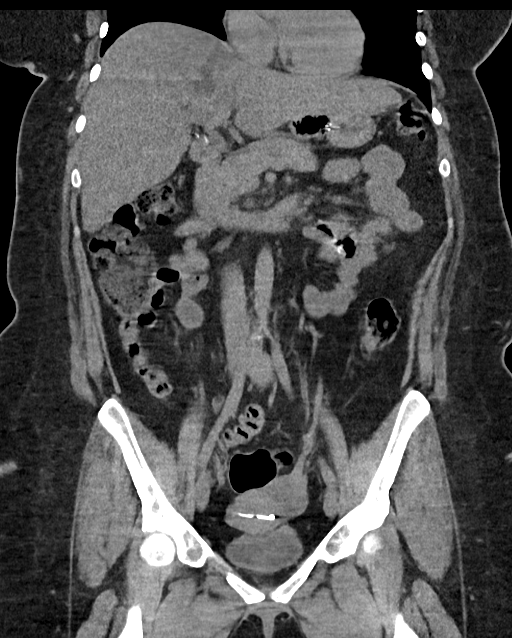
[im 51/92  soft-tissue]
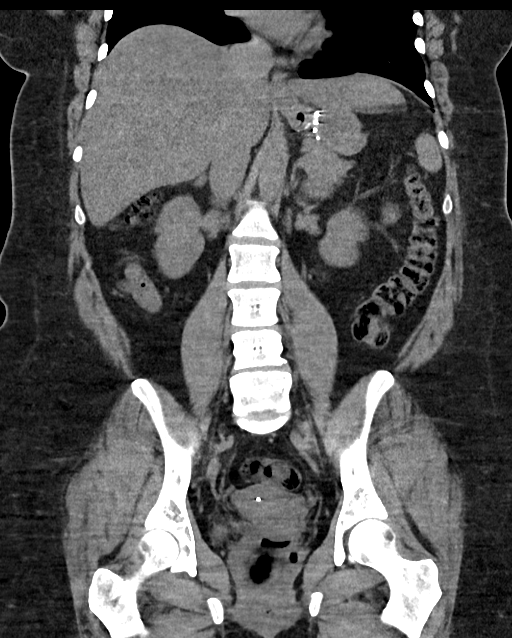

[11 of 46 positions shown; findings below may reference images not displayed]

06/21/18

DIAGNOSTIC STUDIES

EXAM
CT abdomen and pelvis without contrast

INDICATION
RLQ and R flank pain. Stone protocol
RLQ and Rt flank pain. Stone protocol. Neg urine pregnancy test. AW

TECHNIQUE
Volumetric multidetector CT images of the abdomen and pelvis were obtained without administration
of intravenous contrast.
[All CT scans at this facility use dose modulation, iterative reconstruction, and/or weight based
dosing when appropriate to reduce radiation dose to as low as reasonably achievable].

COMPARISONS
None available.

FINDINGS
The visualized lung bases are clear. There is postoperative change of the stomach status post
gastric bypass. There is prior cholecystectomy. The liver, spleen, gallbladder, adrenal glands and
stomach are unremarkable. The pancreas is unremarkable. There is no epigastric, para-aoritc,
mesenteric, or pelvic adenopathy. The aorta is non aneurysmal. The kidneys demonstrate no evidence
of hydronephrosis or distal obstructive calculus. The distal colon demonstrates mild diverticulosis.
The appendix is normal. There is an IUD seen within the endometrial canal. The remaining hollow and
solid abdominal and pelvic viscera are grossly unremarkable. The lumbar spine demonstrates mild
degenerative changes of the lumbar spine are appreciated with mild chronic anterior wedging of the
T12 superior endplate..

IMPRESSION
1. Prior gastric bypass and cholecystectomy. Normal appendix. No evidence of radiopaque calculus or
obstructive uropathy..

Tech Notes:

RLQ and Rt flank pain. Stone protocol. Neg urine pregnancy test. AW

## 2020-03-02 ENCOUNTER — Encounter: Admit: 2020-03-02 | Discharge: 2020-03-02

## 2020-03-02 NOTE — Telephone Encounter
called pt to discuss denial received from insurance. unable to leave VM.

## 2020-03-07 ENCOUNTER — Encounter: Admit: 2020-03-07 | Discharge: 2020-03-07

## 2020-03-07 NOTE — Telephone Encounter
called pt to discuss denial received from insurance. pt will hold off on scheduling at this time

## 2020-09-15 ENCOUNTER — Encounter: Admit: 2020-09-15 | Discharge: 2020-09-15

## 2020-10-12 ENCOUNTER — Encounter: Admit: 2020-10-12 | Discharge: 2020-10-12

## 2020-10-12 ENCOUNTER — Ambulatory Visit: Admit: 2020-10-12 | Discharge: 2020-10-13 | Payer: MEDICAID

## 2020-10-12 DIAGNOSIS — R7303 Prediabetes: Secondary | ICD-10-CM

## 2020-10-12 DIAGNOSIS — R011 Cardiac murmur, unspecified: Secondary | ICD-10-CM

## 2020-10-12 DIAGNOSIS — R000 Tachycardia, unspecified: Secondary | ICD-10-CM

## 2020-10-12 DIAGNOSIS — R6889 Other general symptoms and signs: Secondary | ICD-10-CM

## 2020-10-12 DIAGNOSIS — F909 Attention-deficit hyperactivity disorder, unspecified type: Secondary | ICD-10-CM

## 2020-10-12 DIAGNOSIS — I2510 Atherosclerotic heart disease of native coronary artery without angina pectoris: Secondary | ICD-10-CM

## 2020-10-12 DIAGNOSIS — K929 Disease of digestive system, unspecified: Secondary | ICD-10-CM

## 2020-10-12 DIAGNOSIS — F4310 Post-traumatic stress disorder, unspecified: Secondary | ICD-10-CM

## 2020-10-12 DIAGNOSIS — IMO0002 Unspecified mental or behavioral problem: Secondary | ICD-10-CM

## 2020-10-12 DIAGNOSIS — F419 Anxiety disorder, unspecified: Secondary | ICD-10-CM

## 2020-10-12 DIAGNOSIS — A4902 Methicillin resistant Staphylococcus aureus infection, unspecified site: Secondary | ICD-10-CM

## 2020-10-12 DIAGNOSIS — F32A Depression: Secondary | ICD-10-CM

## 2020-10-12 DIAGNOSIS — K219 Gastro-esophageal reflux disease without esophagitis: Secondary | ICD-10-CM

## 2020-10-12 DIAGNOSIS — E282 Polycystic ovarian syndrome: Secondary | ICD-10-CM

## 2020-10-12 DIAGNOSIS — R569 Unspecified convulsions: Secondary | ICD-10-CM

## 2020-10-12 DIAGNOSIS — F603 Borderline personality disorder: Secondary | ICD-10-CM

## 2020-10-12 MED ORDER — METFORMIN 500 MG PO TAB
500 mg | ORAL_TABLET | Freq: Two times a day (BID) | ORAL | 0 refills | Status: AC
Start: 2020-10-12 — End: ?

## 2020-10-12 NOTE — Progress Notes
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  Patient's nadir weight of 169lbs 06/2020.  Current weight 199lbs (over past 3 months).    Daily routine:  Wakes 7-8:00.  Skips breakfast mostly.  Will occ have multi-grain bar or fruit bar(s) mid-morning.  Lunch:  Salad (chopped kit salad) or yogurt- [place].  Dinner: Will [employer] at home; frequently orders in or has dinner out- [place] take out, rice with chicken.  Reports she will eat 1/2 of a small cup of food for dinner.    Physical activity:    Currently moving to a new apartment.  Prior to this, walking with daily activities.      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 Yes Yes Yes - - - -     Measurements  01/22/2020 07/30/2019 01/30/2019 10/28/2018 04/22/2018 02/21/2018 01/31/2018   BMI 27.29 27.3 30.5 32.2 39.8 44.3 46.9   Weight 169 lb 1.6 oz 169 lb 9.6 oz 189 lb 8 oz 200 lb 246 lb 8 oz 274 lb 9.6 oz 290 lb 8 oz   Right Upper Arm Circumference (Inches) - - - - 19 - -   Waist Circumference (Inches) - - - - 44.5 - -   Hip Circumference (Inches) - - - - 54 - -   Right Thigh Circumference (Inches) - - - - 27.75 - -     No flowsheet data found.        Abdomen - soft, nondistended, incision sites c/d/i, no evidence of hernia    Assessment/Plan:  35 y.o. female s/p L-RYGB with current Body mass index is 31.29 kg/m?. with +30lb weight regain.  Patient with signficant carbohydrate intake.  Will keep food journal.  Refer to dietician.  Rx metformin given history of prediabetes as well as to counter weight-gain potential effects of abilify.    Counseled patient regarding appropriate allotment of macro-nutrients.  We discussed her current exercise activities and methods to try to increase physical activity.         Total time 25 minutes.  15 minutes were spent in face to face dietary and exercise counseling.

## 2020-10-19 ENCOUNTER — Encounter: Admit: 2020-10-19 | Discharge: 2020-10-19

## 2020-10-31 ENCOUNTER — Encounter: Admit: 2020-10-31 | Discharge: 2020-10-31

## 2020-10-31 DIAGNOSIS — R45851 Suicidal ideations: Secondary | ICD-10-CM

## 2020-10-31 LAB — CBC AND DIFF
Lab: 14 % (ref 11–15)
Lab: 15 (ref <20.7)
Lab: 2 % (ref 0–5)
Lab: 3.8 M/UL — ABNORMAL LOW (ref 4.0–5.0)
Lab: 31 pg (ref 26–34)
Lab: 36 % (ref 36–45)
Lab: 38 % (ref 24–44)
Lab: 5.8 K/UL (ref 4.5–11.0)

## 2020-10-31 LAB — TSH WITH FREE T4 REFLEX: Lab: 2.2 uU/mL (ref 0.35–5.00)

## 2020-10-31 LAB — COVID-19 (SARS-COV-2) PCR

## 2020-10-31 LAB — METHADONE-URINE SCREEN: Lab: NEGATIVE % (ref 41–77)

## 2020-10-31 LAB — URINALYSIS DIPSTICK REFLEX TO CULTURE
Lab: 1 /HPF (ref 1.005–1.030)
Lab: 5 (ref 5.0–8.0)
Lab: NEGATIVE
Lab: NEGATIVE
Lab: NEGATIVE
Lab: NEGATIVE
Lab: NEGATIVE

## 2020-10-31 LAB — URINALYSIS MICROSCOPIC REFLEX TO CULTURE

## 2020-10-31 LAB — COMPREHENSIVE METABOLIC PANEL
Lab: 0.3 mg/dL (ref 0.3–1.2)
Lab: 10 K/UL (ref <20.1)
Lab: 13 U/L (ref 7–56)
Lab: >60 mL/min (ref >60)

## 2020-10-31 LAB — OPIATES 300 OR GREATER-URINE RANDOM: Lab: NEGATIVE % (ref 4–12)

## 2020-10-31 LAB — TRICYCLIC SCREEN: Lab: NEGATIVE K/UL (ref 0–0.80)

## 2020-10-31 LAB — AMPHETAMINES-URINE RANDOM: Lab: NEGATIVE mg/dL (ref 70–100)

## 2020-10-31 LAB — BENZODIAZEPINES-URINE RANDOM: Lab: NEGATIVE mg/dL (ref 0.4–1.00)

## 2020-10-31 LAB — COCAINE-URINE RANDOM: Lab: NEGATIVE K/UL (ref 150–400)

## 2020-10-31 MED ORDER — IBUPROFEN 600 MG PO TAB
600 mg | Freq: Once | ORAL | 0 refills | Status: DC
Start: 2020-10-31 — End: 2020-11-01

## 2020-11-01 ENCOUNTER — Encounter: Admit: 2020-11-01 | Discharge: 2020-11-01

## 2020-11-01 ENCOUNTER — Emergency Department: Admit: 2020-11-01 | Discharge: 2020-10-31 | Payer: MEDICAID

## 2020-11-01 DIAGNOSIS — A4902 Methicillin resistant Staphylococcus aureus infection, unspecified site: Secondary | ICD-10-CM

## 2020-11-01 DIAGNOSIS — R011 Cardiac murmur, unspecified: Secondary | ICD-10-CM

## 2020-11-01 DIAGNOSIS — E282 Polycystic ovarian syndrome: Secondary | ICD-10-CM

## 2020-11-01 DIAGNOSIS — R000 Tachycardia, unspecified: Secondary | ICD-10-CM

## 2020-11-01 DIAGNOSIS — R6889 Other general symptoms and signs: Secondary | ICD-10-CM

## 2020-11-01 DIAGNOSIS — K929 Disease of digestive system, unspecified: Secondary | ICD-10-CM

## 2020-11-01 DIAGNOSIS — R569 Unspecified convulsions: Secondary | ICD-10-CM

## 2020-11-01 DIAGNOSIS — R7303 Prediabetes: Secondary | ICD-10-CM

## 2020-11-01 DIAGNOSIS — IMO0002 Unspecified mental or behavioral problem: Secondary | ICD-10-CM

## 2020-11-01 DIAGNOSIS — I2510 Atherosclerotic heart disease of native coronary artery without angina pectoris: Secondary | ICD-10-CM

## 2020-11-01 MED ADMIN — HYDROXYZINE HCL 25 MG PO TAB [3774]: 25 mg | ORAL | @ 14:00:00 | NDC 00904661761

## 2020-11-01 MED ADMIN — ACETAMINOPHEN 325 MG PO TAB [101]: 650 mg | ORAL | @ 10:00:00 | NDC 63739008702

## 2020-11-01 MED ADMIN — OLANZAPINE 5 MG PO TBDI [83006]: 5 mg | ORAL | @ 18:00:00 | NDC 60505327500

## 2020-11-01 MED ADMIN — ACETAMINOPHEN 325 MG PO TAB [101]: 650 mg | ORAL | @ 14:00:00 | NDC 63739008702

## 2020-11-01 MED ADMIN — HYOSCYAMINE SULFATE 0.125 MG PO TBDI [29822]: 0.125 mg | SUBLINGUAL | @ 17:00:00 | NDC 43199001201

## 2020-11-01 MED ADMIN — RISPERIDONE 2 MG PO TAB [79579]: 2 mg | ORAL | @ 17:00:00 | NDC 68084027311

## 2020-11-01 NOTE — ED Notes
35 yo female to ED 72 with suicidal ideation. Pt has hx of attempts in past with hanging and OD'ing on her prescribed psych meds. Pt reports not taking her medications at home as she "does not feel right". Pt denies any recent attempts and current plan to kill herself is by OD'ing on her at home meds and/or attempting hang herself again. Pt denies any medical complaints besides recent dx of ovarian cyst that was dx several weeks ago. Pt calm and cooperative at this time. Pt wishing to go to [facility] for treatment.

## 2020-11-02 MED ADMIN — RISPERIDONE 2 MG PO TAB [79579]: 2 mg | ORAL | @ 13:00:00 | Stop: 2020-11-02 | NDC 00904636061

## 2020-11-02 MED ADMIN — HYOSCYAMINE SULFATE 0.125 MG PO TBDI [29822]: 0.125 mg | SUBLINGUAL | @ 14:00:00 | Stop: 2020-11-02 | NDC 43199001201

## 2020-11-02 MED ADMIN — ACETAMINOPHEN 325 MG PO TAB [101]: 650 mg | ORAL | @ 14:00:00 | Stop: 2020-11-02 | NDC 63739008702

## 2020-11-02 MED ADMIN — ACETAMINOPHEN 325 MG PO TAB [101]: 650 mg | ORAL | @ 02:00:00 | NDC 63739008702

## 2020-11-05 ENCOUNTER — Encounter: Admit: 2020-11-05 | Discharge: 2020-11-05

## 2020-11-05 NOTE — Telephone Encounter
First attempt at follow up phone call. No answer - no identifiers on voicemail, no message left.

## 2020-11-06 ENCOUNTER — Encounter: Admit: 2020-11-06 | Discharge: 2020-11-06

## 2020-11-08 ENCOUNTER — Encounter: Admit: 2020-11-08 | Discharge: 2020-11-08

## 2020-11-08 NOTE — Telephone Encounter
Third attempt at follow up phone call. No answer - no identifiers on voicemail, no message left.

## 2020-11-09 ENCOUNTER — Encounter: Admit: 2020-11-09 | Discharge: 2020-11-09

## 2020-11-17 ENCOUNTER — Encounter: Admit: 2020-11-17 | Discharge: 2020-11-17

## 2020-11-18 ENCOUNTER — Encounter: Admit: 2020-11-18 | Discharge: 2020-11-18

## 2020-11-18 ENCOUNTER — Emergency Department: Admit: 2020-11-18 | Discharge: 2020-11-18

## 2020-11-18 ENCOUNTER — Emergency Department: Admit: 2020-11-18 | Discharge: 2020-11-18 | Payer: MEDICAID

## 2020-11-18 LAB — URINALYSIS MICROSCOPIC REFLEX TO CULTURE

## 2020-11-18 LAB — URINALYSIS DIPSTICK REFLEX TO CULTURE
GLUCOSE,UA: NEGATIVE
NITRITE: NEGATIVE K/UL (ref 0–0.45)
NITRITE: NEGATIVE
PROTEIN,UA: NEGATIVE
URINE ASCORBIC ACID, UA: POSITIVE — AB
URINE ASCORBIC ACID, UA: NEGATIVE
URINE BILE: NEGATIVE K/UL (ref 3–12)
URINE BILE: NEGATIVE
URINE BLOOD: NEGATIVE mL/min (ref 1.0–4.8)
URINE BLOOD: NEGATIVE
URINE KETONE: NEGATIVE
URINE PH: 6 (ref 5.0–8.0)
URINE SPEC GRAVITY: 1 (ref 1.005–1.030)

## 2020-11-18 LAB — COMPREHENSIVE METABOLIC PANEL
BLD UREA NITROGEN: 11 mg/dL (ref 7–25)
CHLORIDE: 109 MMOL/L — ABNORMAL LOW (ref 98–110)
GLUCOSE,PANEL: 93 mg/dL (ref 70–100)
POTASSIUM: 4.1 MMOL/L — ABNORMAL LOW (ref 3.5–5.1)
SODIUM: 141 MMOL/L — ABNORMAL LOW (ref 137–147)

## 2020-11-18 LAB — CBC AND DIFF: WBC COUNT: 7.4 K/UL (ref 4.5–11.0)

## 2020-11-18 LAB — PHOSPHORUS: PHOSPHORUS: 3.2 mg/dL (ref 2.0–4.5)

## 2020-11-18 LAB — POC TROPONIN: TROPONIN I, POC: 0 ng/mL (ref 0.00–0.05)

## 2020-11-18 LAB — LIPASE: LIPASE: 28 U/L — AB (ref 11–82)

## 2020-11-18 LAB — TSH WITH FREE T4 REFLEX: TSH: 0.5 uU/mL — ABNORMAL LOW (ref 0.35–5.00)

## 2020-11-18 LAB — MAGNESIUM: MAGNESIUM: 2.1 mg/dL (ref 1.6–2.6)

## 2020-11-18 MED ORDER — PROCHLORPERAZINE EDISYLATE 5 MG/ML IJ SOLN
10 mg | Freq: Once | INTRAVENOUS | 0 refills | Status: CP
Start: 2020-11-18 — End: ?
  Administered 2020-11-18: 23:00:00 10 mg via INTRAVENOUS

## 2020-11-18 MED ORDER — DIPHENHYDRAMINE HCL 25 MG PO CAP
25 mg | Freq: Once | ORAL | 0 refills | Status: CP
Start: 2020-11-18 — End: ?
  Administered 2020-11-18: 23:00:00 25 mg via ORAL

## 2020-11-18 MED ORDER — LACTATED RINGERS IV SOLP
1000 mL | Freq: Once | INTRAVENOUS | 0 refills | Status: CP
Start: 2020-11-18 — End: ?
  Administered 2020-11-18: 23:00:00 1000 mL via INTRAVENOUS

## 2020-11-18 MED ORDER — MORPHINE 4 MG/ML IV SYRG
4 mg | Freq: Once | INTRAVENOUS | 0 refills | Status: CP
Start: 2020-11-18 — End: ?
  Administered 2020-11-18: 22:00:00 4 mg via INTRAVENOUS

## 2020-11-18 MED ORDER — ACETAMINOPHEN 325 MG PO TAB
650 mg | Freq: Once | ORAL | 0 refills | Status: CP
Start: 2020-11-18 — End: ?
  Administered 2020-11-18: 22:00:00 650 mg via ORAL

## 2020-11-18 MED ORDER — ONDANSETRON HCL (PF) 4 MG/2 ML IJ SOLN
4 mg | Freq: Once | INTRAVENOUS | 0 refills | Status: DC
Start: 2020-11-18 — End: 2020-11-18

## 2020-11-18 NOTE — ED Notes
Report given to [place], [place]. Pt transferred to room 27
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
## 2020-11-19 NOTE — ED Provider Notes
Patient handoff from Dr. Anselm Pancoast,    Patient undergoing transvaginal ultrasound, if medically clear as far as transvaginal ultrasound is concerned patient is free to talk with PLS.     Ultrasound as below    IMPRESSION   1. Small simple left ovarian follicular cyst 1.2 cm.     2. Normal size uterus without endometrial hyperplasia.     Patient medically cleared and stable for transfer to [facility]    Staffed with Dr. Gardiner Coins, MD   Family Medicine PGY-3  [facility]

## 2020-12-01 ENCOUNTER — Encounter: Admit: 2020-12-01 | Discharge: 2020-12-01

## 2020-12-01 ENCOUNTER — Ambulatory Visit: Admit: 2020-12-01 | Discharge: 2020-12-02 | Payer: MEDICAID

## 2020-12-01 DIAGNOSIS — IMO0002 Unspecified mental or behavioral problem: Secondary | ICD-10-CM

## 2020-12-01 DIAGNOSIS — I2510 Atherosclerotic heart disease of native coronary artery without angina pectoris: Secondary | ICD-10-CM

## 2020-12-01 DIAGNOSIS — E282 Polycystic ovarian syndrome: Secondary | ICD-10-CM

## 2020-12-01 DIAGNOSIS — R569 Unspecified convulsions: Secondary | ICD-10-CM

## 2020-12-01 DIAGNOSIS — Z9884 Bariatric surgery status: Secondary | ICD-10-CM

## 2020-12-01 DIAGNOSIS — R7303 Prediabetes: Secondary | ICD-10-CM

## 2020-12-01 DIAGNOSIS — R6889 Other general symptoms and signs: Secondary | ICD-10-CM

## 2020-12-01 DIAGNOSIS — R011 Cardiac murmur, unspecified: Secondary | ICD-10-CM

## 2020-12-01 DIAGNOSIS — R000 Tachycardia, unspecified: Secondary | ICD-10-CM

## 2020-12-01 DIAGNOSIS — A4902 Methicillin resistant Staphylococcus aureus infection, unspecified site: Secondary | ICD-10-CM

## 2020-12-01 DIAGNOSIS — K929 Disease of digestive system, unspecified: Secondary | ICD-10-CM

## 2020-12-01 MED ORDER — SUCRALFATE 1 GRAM PO TAB
1 g | ORAL_TABLET | ORAL | 0 refills | Status: AC
Start: 2020-12-01 — End: ?

## 2020-12-01 MED ORDER — OMEPRAZOLE 40 MG PO CPDR
40 mg | ORAL_CAPSULE | Freq: Two times a day (BID) | ORAL | 3 refills | Status: AC
Start: 2020-12-01 — End: ?

## 2020-12-02 DIAGNOSIS — Z6841 Body Mass Index (BMI) 40.0 and over, adult: Secondary | ICD-10-CM

## 2020-12-28 ENCOUNTER — Encounter: Admit: 2020-12-28 | Discharge: 2020-12-28

## 2020-12-28 MED ORDER — SUCRALFATE 1 GRAM PO TAB
ORAL_TABLET | Freq: Four times a day (QID) | 0 refills | Status: AC
Start: 2020-12-28 — End: ?

## 2021-01-12 ENCOUNTER — Encounter: Admit: 2021-01-12 | Discharge: 2021-01-12

## 2021-01-12 MED ORDER — METFORMIN 500 MG PO TAB
ORAL_TABLET | Freq: Two times a day (BID) | 0 refills | Status: AC
Start: 2021-01-12 — End: ?

## 2021-04-11 ENCOUNTER — Encounter: Admit: 2021-04-11 | Discharge: 2021-04-11

## 2021-04-11 MED ORDER — METFORMIN 500 MG PO TAB
ORAL_TABLET | Freq: Two times a day (BID) | 0 refills | Status: AC
Start: 2021-04-11 — End: ?

## 2021-04-26 ENCOUNTER — Encounter: Admit: 2021-04-26 | Discharge: 2021-04-26

## 2021-07-20 ENCOUNTER — Encounter: Admit: 2021-07-20 | Discharge: 2021-07-20

## 2021-07-20 NOTE — Progress Notes
[name], MRN [id number] with psych history and lap Roux-en-Y in July 2019 here at [place] is inpatient [facility] with partial SBO, on med/surg floor. Their surgeons think she will need bariatric specialists.

## 2021-08-04 ENCOUNTER — Encounter: Admit: 2021-08-04 | Discharge: 2021-08-04

## 2021-09-01 ENCOUNTER — Encounter: Admit: 2021-09-01 | Discharge: 2021-09-01

## 2021-09-01 ENCOUNTER — Ambulatory Visit: Admit: 2021-09-01 | Discharge: 2021-09-02 | Payer: MEDICAID

## 2021-09-01 DIAGNOSIS — K289 Gastrojejunal ulcer, unspecified as acute or chronic, without hemorrhage or perforation: Secondary | ICD-10-CM

## 2021-09-01 DIAGNOSIS — E282 Polycystic ovarian syndrome: Secondary | ICD-10-CM

## 2021-09-01 DIAGNOSIS — IMO0002 Unspecified mental or behavioral problem: Secondary | ICD-10-CM

## 2021-09-01 DIAGNOSIS — R7303 Prediabetes: Secondary | ICD-10-CM

## 2021-09-01 DIAGNOSIS — K929 Disease of digestive system, unspecified: Secondary | ICD-10-CM

## 2021-09-01 DIAGNOSIS — R000 Tachycardia, unspecified: Secondary | ICD-10-CM

## 2021-09-01 DIAGNOSIS — K449 Diaphragmatic hernia without obstruction or gangrene: Secondary | ICD-10-CM

## 2021-09-01 DIAGNOSIS — R569 Unspecified convulsions: Secondary | ICD-10-CM

## 2021-09-01 DIAGNOSIS — R6889 Other general symptoms and signs: Secondary | ICD-10-CM

## 2021-09-01 DIAGNOSIS — I2510 Atherosclerotic heart disease of native coronary artery without angina pectoris: Secondary | ICD-10-CM

## 2021-09-01 DIAGNOSIS — R011 Cardiac murmur, unspecified: Secondary | ICD-10-CM

## 2021-09-01 DIAGNOSIS — Z9884 Bariatric surgery status: Secondary | ICD-10-CM

## 2021-09-01 DIAGNOSIS — A4902 Methicillin resistant Staphylococcus aureus infection, unspecified site: Secondary | ICD-10-CM

## 2021-09-01 MED ORDER — CEFAZOLIN INJ 1GM IVP
2 g | Freq: Once | INTRAVENOUS | 0 refills
Start: 2021-09-01 — End: ?

## 2021-09-01 MED ORDER — FAMOTIDINE (PF) 20 MG/2 ML IV SOLN
20 mg | Freq: Once | INTRAVENOUS | 0 refills
Start: 2021-09-01 — End: ?

## 2021-09-01 MED ORDER — ACETAMINOPHEN 1,000 MG/100 ML (10 MG/ML) IV SOLN
1000 mg | Freq: Once | INTRAVENOUS | 0 refills
Start: 2021-09-01 — End: ?

## 2021-09-01 NOTE — Patient Instructions
09/12/21 Diagnostic Laparoscopic with EGD

## 2021-09-04 ENCOUNTER — Encounter: Admit: 2021-09-04 | Discharge: 2021-09-04

## 2021-09-04 ENCOUNTER — Ambulatory Visit: Admit: 2021-09-04 | Discharge: 2021-09-04 | Payer: MEDICAID

## 2021-09-04 DIAGNOSIS — K929 Disease of digestive system, unspecified: Secondary | ICD-10-CM

## 2021-09-04 DIAGNOSIS — A4902 Methicillin resistant Staphylococcus aureus infection, unspecified site: Secondary | ICD-10-CM

## 2021-09-04 DIAGNOSIS — K289 Gastrojejunal ulcer, unspecified as acute or chronic, without hemorrhage or perforation: Secondary | ICD-10-CM

## 2021-09-04 DIAGNOSIS — Z9884 Bariatric surgery status: Secondary | ICD-10-CM

## 2021-09-04 DIAGNOSIS — R569 Unspecified convulsions: Secondary | ICD-10-CM

## 2021-09-04 DIAGNOSIS — E282 Polycystic ovarian syndrome: Secondary | ICD-10-CM

## 2021-09-04 DIAGNOSIS — R6889 Other general symptoms and signs: Secondary | ICD-10-CM

## 2021-09-04 DIAGNOSIS — K449 Diaphragmatic hernia without obstruction or gangrene: Secondary | ICD-10-CM

## 2021-09-04 DIAGNOSIS — R000 Tachycardia, unspecified: Secondary | ICD-10-CM

## 2021-09-04 DIAGNOSIS — I2510 Atherosclerotic heart disease of native coronary artery without angina pectoris: Secondary | ICD-10-CM

## 2021-09-04 DIAGNOSIS — IMO0002 Unspecified mental or behavioral problem: Secondary | ICD-10-CM

## 2021-09-04 NOTE — Pre-Anesthesia Patient Instructions
GENERAL INFORMATION    Before you come to the hospital  If you are having an outpatient procedure, you will need to arrange for a responsible ride/person to accompany you home due to sedation or anesthesia with your procedure. A responsible person is a person who has the ability to identify a change in the patient's status and notify medical personnel.  This is typically a family member or friend.  Public transportation is permitted if you have a responsible person to accompany you.  An Bened[employer] Goad, taxi or other public transportation driver is not considered a responsible person to accompany you home.  Bath/Shower Instructions  Take a bath or shower with antibacterial soap the night before or the morning of your procedure. Use clean towels.  Put on clean clothes after bath or shower.  Avoid using lotion and oils.  If you are having surgery above the waist, wear a shirt that fastens up the front.  Sleep on clean sheets if bath or shower is done the night before procedure.  Leave money, credit cards, jewelry, and any other valuables at home. [facility] is not responsible for the loss or breakage of personal items.  Remove nail polish, makeup and all jewelry (including piercings) before coming to the hospital.  The morning of your procedure:  brush your teeth and tongue  do not smoke, vape, chew or use any tobacco products  do not shave the area where you will have surgery    What to bring to the hospital  ID/ Insurance Card  Medical Device card  Official documents for legal guardianship   Copy of your Living Will, Advanced Directives, and/or Durable Power of Attorney.  If you have these documents, please bring them to the admissions office on the day of your surgery to be scanned into your records.  Small bag with a few personal belongings  CPAP/BiPAP machine (including all supplies)  Walker, cane, or motorized scooter  Cases for glasses/hearing aids/contact lens (bring solutions for contacts)  Dress in clean, loose, comfortable clothing     Preparing to get your medications at discharge  Your surgeon may prescribe you medications to take after your procedure.  If you would like the convenience of having your medications filled here at [place] please do one of the following:  Go to [place] pharmacy after your [facility] appointment to put a credit card on file.  Call [place] pharmacy at [phone] (Monday-Friday 7am-9pm or Saturday and Sunday 9am-5pm) to put a credit card on file.  Bring a credit card or cash on the day of your procedure- please leave with a family member rather than bringing it into the preop area.     Eating or drinking before surgery  Do not eat or drink anything after 11:00 p.m. the day before your procedure (including gum, mints, candy, or chewing tobacco) OR follow the specific instructions you were given by your Surgeon.  You may have WATER ONLY up to 2 hours before arriving at the hospital.     Other instructions  Notify your surgeon if:  there is a possibility that you are pregnant   you become ill with a cough, fever, sore throat, nausea, vomiting or flu-like symptoms  you have any open wounds/sores that are red, painful, draining, or are new since you last saw the doctor  you need to cancel your procedure    On the day of your procedure, notify us at [facility]: [phone]  if you need to cancel  your procedure  if you are going to be late    Arrival at the hospital  [facility]  [address]  [place], [place] [address]    This is at the [address] of [address]  Use the main entrance of the hospital, at the [facility] corner of the building. Parking is free.  Check-in for surgery is inside the main entrance.  If you are a woman between the ages of 56 and 6, and have not had a hysterectomy, you will be asked for a urine sample prior to surgery.  Please do not urinate before arriving in the Surgery Waiting Room.  Once there, check in and let the attendant know if you need to provide a sample.    Your surgery is scheduled on 09/12/21 at 3:00 p.m.  Your arrival time is 1:30 p.m.        For the safety of all patients, visitors and staff as we work to contain COVID-19, we must restrict patient visitors.    Current Visitor Policy (02/13/21):    Our current, and ongoing, visitor rules in surgery and procedural areas are:    2 visitors per patient will be allowed to accompany the patient and wait in the Waiting Room.     Patients in inpatient and pediatric units, Emergency Department, ambulatory clinics and lab appointments may only have two visitors.       For inpatient stays, patients may have 2 visitors at a time at their bedside. The two visitors can change throughout the day, but no more than two at a time may be bedside.  The policy applies to [facility] [place] [facility] [place], [address], [employer] [place] and [place] campuses and clinics.    Exceptions include:  No visitors allowed for patients with active COVID-19 infections.  Children younger than age 52 are allowed to visit inpatients.  Two parents/guardians are allowed for surgical or procedural patients younger than 35 years old.  Adult inpatients in semiprivate rooms may have visitors, but visits should be coordinated so only two total visitors are in a room at a time due to space limitations.    Visitors must be free of fever and symptoms to be in our facilities. We ask visitors to follow these guidelines:  Wear a mask at all times, unless under the age of 2, have trouble breathing or are unconscious, incapacitated or otherwise unable to remove the cover without assistance.  Go directly to the nursing station in the unit you are visiting and do not linger in public areas.  Check in at the nursing station before going to the patient's room.  Maintain a physical distance of six feet from all others.  Follow elevator restrictions to four riding at a time - peak times are 6:30-7:30 a.m., noon and 6:30-7:30 p.m.  Be aware cafeteria peak times are 11 a.m. - 1 p.m.  Wash your hands frequently and cover your coughs and sneezes.

## 2021-09-04 NOTE — Progress Notes
[facility] phone triage completed with patient scheduled for diagnostic laparoscopy and EGD at [facility] on 09/12/21.  PMH, allergies, and medications reviewed with patient.  Pt. with history of nausea/vomiting, Roux-en-y (01-20-18 at [facility]), seizures, PCOS (Metformin), and anxiety/depression.  Pt. states that she has been having nausea/vomiting for the last 2 months and has been seen in ED for suspected bowel obstruction, but all testing has been negative for any obstruction.  Pt. states that she had a seizure approx. a year ago, and that suspected cause is anxiety/stress, but states that she has never been put on any medications.  Denies CP/DOE, and states that she has no difficulty climbing two flights of stairs.  Current visitor policy reviewed with patient.  Preop and medication instructions reviewed with patient, with patient verbalizing understanding - copy placed in MyChart.

## 2021-09-05 ENCOUNTER — Encounter: Admit: 2021-09-05 | Discharge: 2021-09-05

## 2021-09-08 ENCOUNTER — Encounter: Admit: 2021-09-08 | Discharge: 2021-09-08

## 2021-09-08 NOTE — Telephone Encounter
[name] would like the nurse  to call her. She said you will know what it is about.

## 2021-09-12 ENCOUNTER — Ambulatory Visit: Admit: 2021-09-12 | Discharge: 2021-09-12

## 2021-09-12 ENCOUNTER — Encounter: Admit: 2021-09-12 | Discharge: 2021-09-12

## 2021-09-12 DIAGNOSIS — R6889 Other general symptoms and signs: Secondary | ICD-10-CM

## 2021-09-12 DIAGNOSIS — IMO0002 Unspecified mental or behavioral problem: Secondary | ICD-10-CM

## 2021-09-12 DIAGNOSIS — K929 Disease of digestive system, unspecified: Secondary | ICD-10-CM

## 2021-09-12 DIAGNOSIS — A4902 Methicillin resistant Staphylococcus aureus infection, unspecified site: Secondary | ICD-10-CM

## 2021-09-12 DIAGNOSIS — R569 Unspecified convulsions: Secondary | ICD-10-CM

## 2021-09-12 DIAGNOSIS — E282 Polycystic ovarian syndrome: Secondary | ICD-10-CM

## 2021-09-12 DIAGNOSIS — R000 Tachycardia, unspecified: Secondary | ICD-10-CM

## 2021-09-12 DIAGNOSIS — I2510 Atherosclerotic heart disease of native coronary artery without angina pectoris: Secondary | ICD-10-CM

## 2021-09-12 MED ORDER — PROPOFOL INJ 10 MG/ML IV VIAL
INTRAVENOUS | 0 refills | Status: DC
Start: 2021-09-12 — End: 2021-09-12
  Administered 2021-09-12: 19:00:00 200 mg via INTRAVENOUS

## 2021-09-12 MED ORDER — KETAMINE 10 MG/ML IJ SOLN
INTRAVENOUS | 0 refills | Status: DC
Start: 2021-09-12 — End: 2021-09-12
  Administered 2021-09-12: 19:00:00 20 mg via INTRAVENOUS

## 2021-09-12 MED ORDER — FENTANYL CITRATE (PF) 50 MCG/ML IJ SOLN
INTRAVENOUS | 0 refills | Status: DC
Start: 2021-09-12 — End: 2021-09-12
  Administered 2021-09-12: 19:00:00 100 ug via INTRAVENOUS

## 2021-09-12 MED ORDER — PROPOFOL 10 MG/ML IV EMUL 100 ML (INFUSION)(AM)(OR)
INTRAVENOUS | 0 refills | Status: DC
Start: 2021-09-12 — End: 2021-09-12
  Administered 2021-09-12: 19:00:00 150 ug/kg/min via INTRAVENOUS

## 2021-09-12 MED ORDER — ONDANSETRON HCL (PF) 4 MG/2 ML IJ SOLN
INTRAVENOUS | 0 refills | Status: DC
Start: 2021-09-12 — End: 2021-09-12
  Administered 2021-09-12: 20:00:00 4 mg via INTRAVENOUS

## 2021-09-12 MED ORDER — MIDAZOLAM 1 MG/ML IJ SOLN
INTRAVENOUS | 0 refills | Status: DC
Start: 2021-09-12 — End: 2021-09-12
  Administered 2021-09-12: 19:00:00 2 mg via INTRAVENOUS

## 2021-09-12 MED ORDER — SUGAMMADEX 100 MG/ML IV SOLN
INTRAVENOUS | 0 refills | Status: DC
Start: 2021-09-12 — End: 2021-09-12
  Administered 2021-09-12: 20:00:00 300 mg via INTRAVENOUS

## 2021-09-12 MED ORDER — LIDOCAINE (PF) 200 MG/10 ML (2 %) IJ SYRG
INTRAVENOUS | 0 refills | Status: DC
Start: 2021-09-12 — End: 2021-09-12
  Administered 2021-09-12: 19:00:00 100 mg via INTRAVENOUS

## 2021-09-12 MED ORDER — ARTIFICIAL TEARS (PF) SINGLE DOSE DROPS GROUP
OPHTHALMIC | 0 refills | Status: DC
Start: 2021-09-12 — End: 2021-09-12
  Administered 2021-09-12: 19:00:00 2 [drp] via OPHTHALMIC

## 2021-09-12 MED ORDER — ROCURONIUM 10 MG/ML IV SOLN
INTRAVENOUS | 0 refills | Status: DC
Start: 2021-09-12 — End: 2021-09-12
  Administered 2021-09-12: 19:00:00 40 mg via INTRAVENOUS

## 2021-09-12 MED ADMIN — LACTATED RINGERS IV SOLP [4318]: 1000.000 mL | INTRAVENOUS | @ 18:00:00 | Stop: 2021-09-12 | NDC 00338011704

## 2021-09-12 MED ADMIN — ACETAMINOPHEN 1,000 MG/100 ML (10 MG/ML) IV SOLN [305632]: 1000 mg | INTRAVENOUS | @ 18:00:00 | Stop: 2021-09-12 | NDC 55150030701

## 2021-09-12 MED ADMIN — HYDROMORPHONE (PF) 2 MG/ML IJ SYRG [163476]: 1 mg | INTRAVENOUS | @ 20:00:00 | Stop: 2021-09-12 | NDC 00409336511

## 2021-09-12 MED ADMIN — FENTANYL CITRATE (PF) 50 MCG/ML IJ SOLN [3037]: 50 ug | INTRAVENOUS | @ 20:00:00 | Stop: 2021-09-12 | NDC 00409909412

## 2021-09-12 MED ADMIN — BUPIVACAINE-EPINEPHRINE 0.25 %-1:200,000 IJ SOLN [14983]: 11 mL | INTRAMUSCULAR | @ 20:00:00 | Stop: 2021-09-12 | NDC 00409175250

## 2021-09-12 MED ADMIN — HALOPERIDOL LACTATE 5 MG/ML IJ SOLN [3584]: 1 mg | INTRAVENOUS | @ 20:00:00 | Stop: 2021-09-12 | NDC 63323047400

## 2021-09-12 MED ADMIN — SODIUM CHLORIDE 0.9 % IR SOLN [11403]: 1000 mL | @ 20:00:00 | Stop: 2021-09-12 | NDC 00338004804

## 2021-09-12 MED ADMIN — OXYCODONE 5 MG PO TAB [10814]: 10 mg | ORAL | @ 20:00:00 | Stop: 2021-09-12 | NDC 68084035411

## 2021-09-12 MED ADMIN — CEFAZOLIN INJ 1GM IVP [210319]: 2 g | INTRAVENOUS | @ 19:00:00 | Stop: 2021-09-12 | NDC 60505614200

## 2021-09-12 MED ADMIN — FAMOTIDINE (PF) 20 MG/2 ML IV SOLN [166077]: 20 mg | INTRAVENOUS | @ 18:00:00 | Stop: 2021-09-12 | NDC 67457043300

## 2021-09-12 MED FILL — OXYCODONE 5 MG PO TAB: 5 mg | ORAL | 5 days supply | Qty: 20 | Fill #1 | Status: CP

## 2021-09-14 ENCOUNTER — Encounter: Admit: 2021-09-14 | Discharge: 2021-09-14

## 2021-09-14 DIAGNOSIS — I2510 Atherosclerotic heart disease of native coronary artery without angina pectoris: Secondary | ICD-10-CM

## 2021-09-14 DIAGNOSIS — R000 Tachycardia, unspecified: Secondary | ICD-10-CM

## 2021-09-14 DIAGNOSIS — R6889 Other general symptoms and signs: Secondary | ICD-10-CM

## 2021-09-14 DIAGNOSIS — K929 Disease of digestive system, unspecified: Secondary | ICD-10-CM

## 2021-09-14 DIAGNOSIS — R569 Unspecified convulsions: Secondary | ICD-10-CM

## 2021-09-14 DIAGNOSIS — IMO0002 Unspecified mental or behavioral problem: Secondary | ICD-10-CM

## 2021-09-14 DIAGNOSIS — A4902 Methicillin resistant Staphylococcus aureus infection, unspecified site: Secondary | ICD-10-CM

## 2021-09-14 DIAGNOSIS — E282 Polycystic ovarian syndrome: Secondary | ICD-10-CM

## 2021-09-15 ENCOUNTER — Encounter: Admit: 2021-09-15 | Discharge: 2021-09-15

## 2021-09-15 DIAGNOSIS — E282 Polycystic ovarian syndrome: Secondary | ICD-10-CM

## 2021-09-15 DIAGNOSIS — R569 Unspecified convulsions: Secondary | ICD-10-CM

## 2021-09-15 DIAGNOSIS — R000 Tachycardia, unspecified: Secondary | ICD-10-CM

## 2021-09-15 DIAGNOSIS — IMO0002 Unspecified mental or behavioral problem: Secondary | ICD-10-CM

## 2021-09-15 DIAGNOSIS — R6889 Other general symptoms and signs: Secondary | ICD-10-CM

## 2021-09-15 DIAGNOSIS — I2510 Atherosclerotic heart disease of native coronary artery without angina pectoris: Secondary | ICD-10-CM

## 2021-09-15 DIAGNOSIS — A4902 Methicillin resistant Staphylococcus aureus infection, unspecified site: Secondary | ICD-10-CM

## 2021-09-15 DIAGNOSIS — K929 Disease of digestive system, unspecified: Secondary | ICD-10-CM

## 2021-09-16 ENCOUNTER — Encounter: Admit: 2021-09-16 | Discharge: 2021-09-16

## 2021-09-16 ENCOUNTER — Emergency Department: Admit: 2021-09-16 | Discharge: 2021-09-16

## 2021-09-16 ENCOUNTER — Emergency Department: Admit: 2021-09-16 | Discharge: 2021-09-15 | Payer: MEDICAID

## 2021-09-16 MED ADMIN — FENTANYL CITRATE (PF) 50 MCG/ML IJ SOLN [3037]: 50 ug | INTRAVENOUS | @ 09:00:00 | Stop: 2021-09-16 | NDC 00409909412

## 2021-09-16 MED ADMIN — LACTATED RINGERS IV SOLP [4318]: 1000 mL | INTRAVENOUS | @ 09:00:00 | Stop: 2021-09-16 | NDC 00338011704

## 2021-09-16 MED ADMIN — IOHEXOL 350 MG IODINE/ML IV SOLN [81210]: 100 mL | INTRAVENOUS | @ 10:00:00 | Stop: 2021-09-16 | NDC 00407141491

## 2021-09-16 MED ADMIN — SODIUM CHLORIDE 0.9 % IJ SOLN [7319]: 50 mL | INTRAVENOUS | @ 10:00:00 | Stop: 2021-09-16 | NDC 00409488820

## 2021-09-16 MED ADMIN — FENTANYL CITRATE (PF) 50 MCG/ML IJ SOLN [3037]: 50 ug | INTRAVENOUS | @ 11:00:00 | Stop: 2021-09-16 | NDC 00409909412

## 2021-09-16 MED ADMIN — DROPERIDOL 2.5 MG/ML IJ SOLN [2654]: 1.25 mg | INTRAVENOUS | @ 12:00:00 | Stop: 2021-09-16 | NDC 00517970201

## 2021-09-16 MED ADMIN — PANTOPRAZOLE 40 MG IV SOLR [78621]: 80 mg | INTRAVENOUS | @ 12:00:00 | Stop: 2021-09-16 | NDC 55150020200

## 2021-09-16 MED ADMIN — ONDANSETRON HCL (PF) 4 MG/2 ML IJ SOLN [136012]: 4 mg | INTRAVENOUS | @ 09:00:00 | Stop: 2021-09-16 | NDC 00641607801

## 2021-09-17 ENCOUNTER — Encounter: Admit: 2021-09-17 | Discharge: 2021-09-17

## 2021-09-18 ENCOUNTER — Encounter: Admit: 2021-09-18 | Discharge: 2021-09-18

## 2021-09-22 ENCOUNTER — Encounter: Admit: 2021-09-22 | Discharge: 2021-09-22

## 2021-09-22 ENCOUNTER — Ambulatory Visit: Admit: 2021-09-22 | Discharge: 2021-09-23 | Payer: MEDICAID

## 2021-09-22 DIAGNOSIS — R6889 Other general symptoms and signs: Secondary | ICD-10-CM

## 2021-09-22 DIAGNOSIS — I2510 Atherosclerotic heart disease of native coronary artery without angina pectoris: Secondary | ICD-10-CM

## 2021-09-22 DIAGNOSIS — A4902 Methicillin resistant Staphylococcus aureus infection, unspecified site: Secondary | ICD-10-CM

## 2021-09-22 DIAGNOSIS — R000 Tachycardia, unspecified: Secondary | ICD-10-CM

## 2021-09-22 DIAGNOSIS — E282 Polycystic ovarian syndrome: Secondary | ICD-10-CM

## 2021-09-22 DIAGNOSIS — K929 Disease of digestive system, unspecified: Secondary | ICD-10-CM

## 2021-09-22 DIAGNOSIS — IMO0002 Unspecified mental or behavioral problem: Secondary | ICD-10-CM

## 2021-09-22 DIAGNOSIS — R569 Unspecified convulsions: Secondary | ICD-10-CM

## 2021-09-22 MED ORDER — RECTIV 0.4 % (W/W) RE OINT
1 g | Freq: Three times a day (TID) | RECTAL | 1 refills | Status: AC
Start: 2021-09-22 — End: ?

## 2021-09-22 NOTE — Telephone Encounter
[name] called after her appointment Friday, 09/22/21 and said she forgot to talk to the nurse or Dr. Velva Harman about pain management. She would like a call back on her cell phone. She isn't able to get in My Chart.

## 2021-09-25 ENCOUNTER — Encounter: Admit: 2021-09-25 | Discharge: 2021-09-25

## 2021-09-27 ENCOUNTER — Encounter: Admit: 2021-09-27 | Discharge: 2021-09-27

## 2021-09-27 NOTE — Telephone Encounter
Office of Dr. Su Grand (PCP) calling to ask if patient is needing to use rectiv cream for a specific period of time (as in a certain number of days)      [phone]

## 2021-10-09 ENCOUNTER — Encounter: Admit: 2021-10-09 | Discharge: 2021-10-09

## 2021-10-09 NOTE — Telephone Encounter
Patient called to speak with someone regarding GI issues / discomfort

## 2021-10-09 NOTE — Telephone Encounter
09/12/2021 Diagnostic Laparoscopy with Closure of Internal Hernia by Dr. Velva Harman    Contacted patient.  Patient states that she is experiencing ongoing nausea, vomiting, and diarrhea since her most recent procedure.  Patient states that she is having 5-10 episodes of diarrhea per day, constant nausea, and vomiting when she tries to eat, drink, and take her medications.  Current medications are: Zofran 4mg  PO TID, Omeprazole 40mg  BID, Hydroxyzine QID, Latuda 120mg  PO Daily, and Prazosin BID.  Patient states that she has tried opening her capsules and adding to applesauce without success.

## 2021-10-12 ENCOUNTER — Encounter: Admit: 2021-10-12 | Discharge: 2021-10-12

## 2021-10-12 ENCOUNTER — Ambulatory Visit: Admit: 2021-10-12 | Discharge: 2021-10-13 | Payer: MEDICAID

## 2021-10-12 NOTE — Telephone Encounter
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 Experienced nausea / vomiting last night.     Requests a phone call to discuss other medication options

## 2021-10-12 NOTE — Progress Notes
Clinical Nutrition Note    [name] is a 36 y.o. 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 Spoke to patient over the phone today for 4 year post-op follow-up.       Nutrition Assessment of Patient:       Wt Readings from Last 5 Encounters:   09/22/21 105.1 kg (231 lb 9.6 oz)   09/15/21 105.7 kg (233 lb)   09/12/21 105.9 kg (233 lb 6.4 oz)   09/01/21 106.7 kg (235 lb 3.2 oz)   12/01/20 93.4 kg (205 lb 14.4 oz)        There is no height or weight on file to calculate BMI.           Pertinent Hx: Diarrhea due to unknown cause.  Pertinent Labs: Glucose 101 (H), 2020 Vit D 29.5 (L), Vit A 20.9 (L), B12 174 (L), Iron Low      Diet recall:  Not eating as much:  Breakfast - [employer] granola bar fried eggs X2 and bacon   Lunch - 1-2 healthy choice tv dinners 9-16 g protein   Dinner - salad w/ dressing no protein - bacon bits and canned chicken - Small amount of whatever mom cooks  Estimate of protein 1 or two heat and serve 9  [employer] driver - beef jerky snack     Duration of Meals: Discussed intentional eating ~20-30 minute meals, ensuring focus on protein and non-starchy vegetables.   Food Intolerances: Completely avoiding dairy.    Daily Physical Activity: Drives for [employer]    Vitamin/Mineral Supplementation:  B12 injections - fluctuation - loss of fnx when needs new injections  Vitamin D 1000mg  - 1-2/day   No other supplementation.     Intervention/Plan:  Discussed focusing on non-sugar sweetened beverages for hydration to help avoid dumping associated with excess sugar intake.     Focus on protein intake at beginning of meal that is lower in fat to promote digestion and increase food tolerance.     Recommended having labs rechecked as most recent labs are 2020.      Nutrition Monitoring and Evaluation:  Goal: Weight loss (2-5 lbs/week)  Time Frame:  Until BMI <25 is reached or weight stabilizes      The patient was allowed to ask questions and actively participated in creating plan of care. I provided the patient with my contact information and instructed pt on how to get in touch with me if questions or concerns arise. Thank you for allowing nutrition services to participate in this patients care.     Follow up Date: PRN    Dierdre Highman  Dietetic Intern  Available on Johnney Ou, RD,CSOWM, LD   Clinical Nutrition Specialist  Available on [facility]

## 2021-11-08 ENCOUNTER — Encounter: Admit: 2021-11-08 | Discharge: 2021-11-08

## 2021-11-08 MED ORDER — COLESTIPOL 1 GRAM PO TAB
ORAL_TABLET | ORAL | 0 refills | 37.50000 days | Status: AC
Start: 2021-11-08 — End: ?

## 2021-12-01 ENCOUNTER — Encounter: Admit: 2021-12-01 | Discharge: 2021-12-01

## 2021-12-19 ENCOUNTER — Encounter: Admit: 2021-12-19 | Discharge: 2021-12-19

## 2021-12-20 ENCOUNTER — Encounter: Admit: 2021-12-20 | Discharge: 2021-12-20

## 2021-12-20 NOTE — Telephone Encounter
Lvm to complete paperwork in MyChart prior to the appointment. Also bring any recent images outside of [place].

## 2021-12-22 ENCOUNTER — Ambulatory Visit: Admit: 2021-12-22 | Discharge: 2021-12-22 | Payer: MEDICAID

## 2021-12-22 ENCOUNTER — Encounter: Admit: 2021-12-22 | Discharge: 2021-12-22

## 2021-12-22 DIAGNOSIS — E282 Polycystic ovarian syndrome: Secondary | ICD-10-CM

## 2021-12-22 DIAGNOSIS — R6889 Other general symptoms and signs: Secondary | ICD-10-CM

## 2021-12-22 DIAGNOSIS — T148XXA Other injury of unspecified body region, initial encounter: Secondary | ICD-10-CM

## 2021-12-22 DIAGNOSIS — K929 Disease of digestive system, unspecified: Secondary | ICD-10-CM

## 2021-12-22 DIAGNOSIS — M546 Pain in thoracic spine: Secondary | ICD-10-CM

## 2021-12-22 DIAGNOSIS — I2510 Atherosclerotic heart disease of native coronary artery without angina pectoris: Secondary | ICD-10-CM

## 2021-12-22 DIAGNOSIS — R569 Unspecified convulsions: Secondary | ICD-10-CM

## 2021-12-22 DIAGNOSIS — M7918 Myalgia, other site: Secondary | ICD-10-CM

## 2021-12-22 DIAGNOSIS — A4902 Methicillin resistant Staphylococcus aureus infection, unspecified site: Secondary | ICD-10-CM

## 2021-12-22 DIAGNOSIS — R000 Tachycardia, unspecified: Secondary | ICD-10-CM

## 2021-12-22 DIAGNOSIS — W19XXXA Unspecified fall, initial encounter: Secondary | ICD-10-CM

## 2021-12-22 DIAGNOSIS — IMO0002 Unspecified mental or behavioral problem: Secondary | ICD-10-CM

## 2021-12-22 MED ORDER — BACLOFEN 10 MG PO TAB
10 mg | ORAL_TABLET | Freq: Three times a day (TID) | ORAL | 1 refills | Status: AC | PRN
Start: 2021-12-22 — End: ?

## 2021-12-28 ENCOUNTER — Encounter: Admit: 2021-12-28 | Discharge: 2021-12-28

## 2021-12-28 ENCOUNTER — Ambulatory Visit: Admit: 2021-12-28 | Discharge: 2021-12-29 | Payer: MEDICAID

## 2021-12-28 DIAGNOSIS — R569 Unspecified convulsions: Secondary | ICD-10-CM

## 2021-12-28 DIAGNOSIS — A4902 Methicillin resistant Staphylococcus aureus infection, unspecified site: Secondary | ICD-10-CM

## 2021-12-28 DIAGNOSIS — K929 Disease of digestive system, unspecified: Secondary | ICD-10-CM

## 2021-12-28 DIAGNOSIS — IMO0002 Unspecified mental or behavioral problem: Secondary | ICD-10-CM

## 2021-12-28 DIAGNOSIS — R000 Tachycardia, unspecified: Secondary | ICD-10-CM

## 2021-12-28 DIAGNOSIS — I2510 Atherosclerotic heart disease of native coronary artery without angina pectoris: Secondary | ICD-10-CM

## 2021-12-28 DIAGNOSIS — R6889 Other general symptoms and signs: Secondary | ICD-10-CM

## 2021-12-28 DIAGNOSIS — E282 Polycystic ovarian syndrome: Secondary | ICD-10-CM

## 2021-12-29 DIAGNOSIS — Z9884 Bariatric surgery status: Secondary | ICD-10-CM

## 2022-01-14 ENCOUNTER — Encounter: Admit: 2022-01-14 | Discharge: 2022-01-14

## 2022-02-15 ENCOUNTER — Encounter: Admit: 2022-02-15 | Discharge: 2022-02-15

## 2022-02-15 MED ORDER — OMEPRAZOLE 40 MG PO CPDR
40 mg | ORAL_CAPSULE | Freq: Two times a day (BID) | ORAL | 1 refills | Status: AC
Start: 2022-02-15 — End: ?

## 2022-02-15 NOTE — Telephone Encounter
09/12/2021 Diagnostic Laparoscopy with Closure of Internal Hernia with Dr. Velva Harman  01/20/2018 RNY with Dr. Velva Harman    Patient called office.  She states that for the last 2 weeks, she has been struggling with nausea, vomiting, diarrhea, dizziness, headache, epigastric pain, and decreased urine output.  Patient states that the symptoms happen every time she eats or drinks anything.  She has not been keeping down any of her PO medications due to the vomiting.  Patient has been taking Zofran, Tylenol, Ibuprofen, and Gas-X without relief.  Patient reports that she is currently smoking 2 packs per day.    Current Medications per patient report:    Omeprazole 40mg  Daily  Carafate 1g Q6hrs  Trazodone 300mg  QHS  Gabapentin 300mg  TID    Per Dr. , no EGD at this time.  Patient to increase Omeprazole to 40mg  BID.  Script sent to patient's pharmacy.  Encouraged to stop smoking as symptoms will not improve if she does not.      Patient voices understanding and agrees.

## 2022-02-15 NOTE — Telephone Encounter
Patient requesting phone call    Patient states she is unable to use MyChart at this time

## 2022-02-15 NOTE — Telephone Encounter
Attempted to contact patient, left message to call office.

## 2022-06-22 ENCOUNTER — Encounter: Admit: 2022-06-22 | Discharge: 2022-06-22

## 2022-07-04 ENCOUNTER — Encounter: Admit: 2022-07-04 | Discharge: 2022-07-04

## 2022-07-05 ENCOUNTER — Encounter: Admit: 2022-07-05 | Discharge: 2022-07-05

## 2022-07-05 MED ORDER — ONDANSETRON 4 MG PO TBDI
4 mg | ORAL_TABLET | ORAL | 0 refills | 8.00000 days | Status: AC | PRN
Start: 2022-07-05 — End: ?

## 2022-07-05 MED ORDER — SUCRALFATE 1 GRAM PO TAB
1 g | ORAL_TABLET | ORAL | 0 refills | Status: AC
Start: 2022-07-05 — End: ?

## 2022-07-06 ENCOUNTER — Encounter: Admit: 2022-07-06 | Discharge: 2022-07-06

## 2022-07-11 ENCOUNTER — Encounter: Admit: 2022-07-11 | Discharge: 2022-07-11

## 2022-07-24 ENCOUNTER — Ambulatory Visit: Admit: 2022-07-24 | Discharge: 2022-07-25 | Payer: MEDICAID

## 2022-07-24 ENCOUNTER — Encounter: Admit: 2022-07-24 | Discharge: 2022-07-24

## 2022-07-24 NOTE — Progress Notes
Clinical Nutrition Note    [name] is a 37 y.o. 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 Spoke to patient via telephone today for post-op follow-up regarding GERD, nausea, abdominal pain.    Nutrition Assessment of Patient:  Height: 167.6 cm (5' 5.98)  Weight: 93 kg (205 lb)  BMI (Calculated): 33.1  BMI Categories Adult: Obesity Class II: 35-39.9  Estimated Protein Needs: 80-100g  Needs to promote: weight loss    Wt Readings from Last 5 Encounters:   07/24/22 93 kg (205 lb)   12/28/21 101.7 kg (224 lb 1.6 oz)   12/22/21 101.2 kg (223 lb)   09/22/21 105.1 kg (231 lb 9.6 oz)   09/15/21 105.7 kg (233 lb)        Body mass index is 33.1 kg/m?Marland Kitchen    Patient reports:  persistent daily nausea, abdominal pain (reports this has occurred since surgery, feels like it gets worse)  she is taking zofran, tylenol  she feels like symptoms occur no matter what she attempts to eat  notes she has to take 5-10 min breaks during meals to have her stomach settle (feels like it might come back up)  using a normal sized plate  typical meals include: mixed veggies, mashed potatoes and gravy, pork cutlets (breaded lightly and fried in olive oil/butter)  hydration: feels like this isn't going well, attempting to drink water, will drink a soda sometimes (feels like she can't finish it)  she is drinking with meals     Vitamin/Mineral Supplementation: taking vitamin D, iron, she is not currently taking a bariatric multivitamin    Intervention/Plan:  Start bariatric multivitamin daily  Increase electrolyte additions to water to help increase tolerance of fluids - aim for 64 ounces per day  Avoid drinking with meals and wait 30 mins after meal before drinking  Decrease portions to 1/2 cup food, eating slowly over 20-30 mins, stopping when satisfied, before pain or nausea.      Nutrition Monitoring and Evaluation:  Goal: Reduction of nausea/abdominal pain  Time Frame:  Continuous      The patient was allowed to ask questions and actively participated in creating plan of care. I provided the patient with my contact information and instructed pt on how to get in touch with me if questions or concerns arise. Thank you for allowing nutrition services to participate in this patients care.     Follow up Date: PRN or 6 weeks    Elsie Saas, RD,CSOWM, LD   Clinical Nutrition Specialist  Available on [employer]

## 2022-08-15 ENCOUNTER — Encounter: Admit: 2022-08-15 | Discharge: 2022-08-15

## 2022-08-16 ENCOUNTER — Encounter: Admit: 2022-08-16 | Discharge: 2022-08-16

## 2022-09-03 ENCOUNTER — Encounter: Admit: 2022-09-03 | Discharge: 2022-09-03

## 2022-09-05 ENCOUNTER — Ambulatory Visit: Admit: 2022-09-05 | Discharge: 2022-09-06 | Payer: MEDICAID

## 2022-09-05 ENCOUNTER — Encounter: Admit: 2022-09-05 | Discharge: 2022-09-05

## 2022-09-05 NOTE — Progress Notes
Clinical Nutrition Note    [name] is a 37 y.o. 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 Spoke to patient via telephone today for post-op follow-up regarding GERD, nausea, abdominal pain.     Nutrition Assessment of Patient:  Height: 167.6 cm (5' 5.98)  Weight: 90.3 kg (199 lb)  BMI (Calculated): 32.13  BMI Categories Adult: Obesity Class I: 30-34.9  Estimated Protein Needs: 80-100g  Needs to promote: weight loss    Wt Readings from Last 5 Encounters:   09/05/22 90.3 kg (199 lb)   07/24/22 93 kg (205 lb)   12/28/21 101.7 kg (224 lb 1.6 oz)   12/22/21 101.2 kg (223 lb)   09/22/21 105.1 kg (231 lb 9.6 oz)        Body mass index is 32.14 kg/m?Marland Kitchen      Patient reports:  continue to have abdominal pain and nausea, unchanged by diet adjustements  reports she is unable to eat and drink more things, she is attempting to stay hydrated  recently had COVID and now dealing with a cold  recent ED visit with elevated alk phos labs - no recent vitamin labs  she has not started a multivitamin  meeting with Dr. Velva Harman next week for further needs    Intervention/Plan:  Start bariatric multivitamin daily  Increase electrolyte additions to water to help increase tolerance of fluids - aim for 64 ounces per day  Avoid drinking with meals and wait 30 mins after meal before drinking  Decrease portions to 1/2 cup food, eating slowly over 20-30 mins, stopping when satisfied, before pain or nausea.         Nutrition Monitoring and Evaluation:  Goal: Adequate nutrition intake; reduction of nausea/abdominal pain  Time Frame:  continuous      The patient was allowed to ask questions and actively participated in creating plan of care. I provided the patient with my contact information and instructed pt on how to get in touch with me if questions or concerns arise. Thank you for allowing nutrition services to participate in this patients care.     Follow up Date: PRN     Elsie Saas, RD,CSOWM, LD   Clinical Nutrition Specialist  Available on Voalte

## 2022-09-14 ENCOUNTER — Encounter: Admit: 2022-09-14 | Discharge: 2022-09-14

## 2022-09-14 ENCOUNTER — Ambulatory Visit: Admit: 2022-09-14 | Discharge: 2022-09-15 | Payer: MEDICAID

## 2022-09-14 DIAGNOSIS — R000 Tachycardia, unspecified: Secondary | ICD-10-CM

## 2022-09-14 DIAGNOSIS — A4902 Methicillin resistant Staphylococcus aureus infection, unspecified site: Secondary | ICD-10-CM

## 2022-09-14 DIAGNOSIS — K929 Disease of digestive system, unspecified: Secondary | ICD-10-CM

## 2022-09-14 DIAGNOSIS — I2510 Atherosclerotic heart disease of native coronary artery without angina pectoris: Secondary | ICD-10-CM

## 2022-09-14 DIAGNOSIS — R569 Unspecified convulsions: Secondary | ICD-10-CM

## 2022-09-14 DIAGNOSIS — R6889 Other general symptoms and signs: Secondary | ICD-10-CM

## 2022-09-14 DIAGNOSIS — IMO0002 Unspecified mental or behavioral problem: Secondary | ICD-10-CM

## 2022-09-14 DIAGNOSIS — E282 Polycystic ovarian syndrome: Secondary | ICD-10-CM

## 2022-09-14 DIAGNOSIS — Z9884 Bariatric surgery status: Secondary | ICD-10-CM

## 2022-09-14 MED ORDER — OMEPRAZOLE 40 MG PO CPDR
40 mg | ORAL_CAPSULE | Freq: Two times a day (BID) | ORAL | 1 refills | Status: AC
Start: 2022-09-14 — End: ?

## 2022-09-14 MED ORDER — SUCRALFATE 1 GRAM PO TAB
1 g | ORAL_TABLET | ORAL | 0 refills | Status: AC
Start: 2022-09-14 — End: ?

## 2022-09-14 NOTE — Progress Notes
Department of Metabolic, Bariatric, and Minimally Invasive Surgery         Reason for Visit:  Ongoing bariatric evaluation    HPI:  [name] is a 37 y.o. 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    In summary of their prior history:  She has had abdominal pain and nausea associated with eating that has worsened over the last two months. She describes the pain as starting mid-epigastric and then going to the left abdomen. She has not had relief with going to a liquid diet for a few days, and then resumes her normal eating habits. She is unable to finish meals due to this pain. Her diet has not been adequate due to this pain.  She admits to smoking and vaping. She is not currently taking Pepcid or Carafate.     Current Medications:    Current Outpatient Medications:     cholecalciferol (VITAMIN D-3) 1,000 units tablet, Take one tablet by mouth daily., Disp: , Rfl:     cyanocobalamin (vitamin B-12) (CYANOCOBALAMIN(DIL) (VITAMIN B-12)) 100 mcg/mL, Inject  into the muscle., Disp: , Rfl:     omeprazole DR (PRILOSEC) 40 mg capsule, Take one capsule by mouth twice daily., Disp: 180 capsule, Rfl: 1    ondansetron (ZOFRAN ODT) 4 mg rapid dissolve tablet, Dissolve one tablet by mouth every 8 hours as needed for Nausea or Vomiting. Place on tongue to dissolve., Disp: 60 tablet, Rfl: 0    sucralfate (CARAFATE) 1 gram tablet, Take one tablet by mouth every 6 hours. Take on an empty stomach. Make into slurry, adding enough water to make a syrup like substance., Disp: 360 tablet, Rfl: 0    Allergies:  Allergies   Allergen Reactions    Mushroom ANAPHYLAXIS    Asa [Aspirin] STOMACH UPSET     Roux-En-Y on 01/20/18.    Coconut HIVES and RASH    Nsaids (Non-Steroidal Anti-Inflammatory Drug) STOMACH UPSET     Roux-En-Y on 01/20/18.    Prozac [Fluoxetine] HIVES    Sulfa (Sulfonamide Antibiotics) RASH    Yellow Dye HIVES and RASH    Pristiq [Desvenlafaxine Succinate] UNKNOWN PE:  Vitals:    09/14/22 1032   BP: 124/73   Pulse: 82   Temp: 36.3 ?C (97.3 ?F)   SpO2: 100%   PainSc: Seven   Weight: 88.4 kg (194 lb 14.4 oz)   Height: 167.6 cm (5' 6)        88.4 kg (194 lb 14.4 oz)  Body mass index is 31.46 kg/m?Marland Kitchen  Physical Exam  Constitutional:       Appearance: Normal appearance.   HENT:      Head: Normocephalic and atraumatic.   Eyes:      Extraocular Movements: Extraocular movements intact.   Cardiovascular:      Pulses: Normal pulses.      Heart sounds: Normal heart sounds.   Pulmonary:      Effort: Pulmonary effort is normal.      Breath sounds: Normal breath sounds.   Abdominal:      General: Abdomen is flat.      Palpations: Abdomen is soft.      Tenderness: There is abdominal tenderness (left abominal).   Musculoskeletal:         General: Normal range of motion.      Cervical back: Normal range of motion.   Skin:     General: Skin is warm and dry.   Neurological:  Mental Status: She is alert.         Lab/Radiology/Other Diagnostic Tests:  Lab Results   Component Value Date    HGB 10.2 (L) 09/16/2021    HCT 31.9 (L) 09/16/2021    PLTCT 492 (H) 09/16/2021    NEUT 49 09/16/2021    ANC 2.98 09/16/2021    ALC 2.34 09/16/2021    MONA 9 09/16/2021    AMC 0.54 09/16/2021    EOSA 2 09/16/2021    ABC 0.05 09/16/2021    MCV 83.7 09/16/2021    MCH 26.6 09/16/2021    MCHC 31.8 (L) 09/16/2021    MPV 7.1 09/16/2021    RDW 16.1 (H) 09/16/2021     Lab Results   Component Value Date    NA 142 09/16/2021    K 3.9 09/16/2021    CL 106 09/16/2021    CO2 26 09/16/2021    GAP 10 09/16/2021    BUN 10 09/16/2021    CR 0.66 09/16/2021    GLU 101 (H) 09/16/2021    GLU 99 05/13/2004    CA 9.6 09/16/2021    ALBUMIN 4.2 09/16/2021    LACTIC 2.5 (H) 07/14/2007    MG 2.1 11/18/2020    TOTBILI 0.3 09/16/2021    PO4 3.2 11/18/2020     Lab Results   Component Value Date    AST 17 09/16/2021    ALT 15 09/16/2021    ALKPHOS 129 (H) 09/16/2021     Lab Results   Component Value Date    CHOL 134 12/10/2019    TRIG 70 12/10/2019    HDL 50 12/10/2019    LDL 80 12/10/2019    VLDL 14 12/10/2019     Lab Results   Component Value Date    TSH 0.50 11/18/2020     Lab Results   Component Value Date    HGBA1C 5.1 12/10/2019                  Assessment:  37 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     Plan:  We discussed the importance to stop smoking to allow any ulcer the opportunity to heal. We prescribed Prilosec and Carafate to promote healing of any ulcer, as well. We discussed how it may take multiple months of not smoking and continuing with this plan to have resolution of symptoms. We discussed again the importance of a healthy diet with protein for post-bariatric surgery patients. All questions and concerns were addressed and she understood our plan going forward. She should reach out with any new concerns or worsening.     Seen and discussed with Dr. Velva Harman.   Marylin Crosby, MS4    ATTESTATION    I personally performed or re-performed the history, physical exam and treatment for the E/M. I discussed the case with the Medical Student, and concur with the Medical Student documentation of history, physical exam and treatment plan unless otherwise noted.   Patient with epigastric pain symptoms consistent with marginal ulcer.  Patient admits to active smoking and vaping.  We discussed again and reviewed the provided information that smoking is 100% contraindicated with the gastric bypass and that this is a lifelong necessity.  Discussed marginal ulcer treatment regimen with PPI opened in applesauce and carafate slurry.  Discussed absolute importance of smoking cessation.    Staff  name:  Bufford Lope, MD Date: 09/14/2022

## 2022-09-26 ENCOUNTER — Encounter: Admit: 2022-09-26 | Discharge: 2022-09-26

## 2022-10-01 ENCOUNTER — Encounter: Admit: 2022-10-01 | Discharge: 2022-10-01

## 2022-10-29 ENCOUNTER — Encounter: Admit: 2022-10-29 | Discharge: 2022-10-29

## 2022-10-30 ENCOUNTER — Encounter: Admit: 2022-10-30 | Discharge: 2022-10-30

## 2022-10-30 ENCOUNTER — Emergency Department: Admit: 2022-10-30 | Discharge: 2022-10-29 | Payer: MEDICAID

## 2022-10-30 ENCOUNTER — Emergency Department: Admit: 2022-10-30 | Discharge: 2022-10-30

## 2022-10-30 MED ADMIN — ONDANSETRON HCL (PF) 4 MG/2 ML IJ SOLN [136012]: 4 mg | INTRAVENOUS | @ 08:00:00 | Stop: 2022-10-30 | NDC 00641607801

## 2022-10-30 MED ADMIN — DIPHENHYDRAMINE HCL 50 MG/ML IJ SOLN [2508]: 50 mg | INTRAVENOUS | @ 07:00:00 | Stop: 2022-10-30 | NDC 00641037621

## 2022-10-30 MED ADMIN — MAGNESIUM SULFATE IN D5W 1 GRAM/100 ML IV PGBK [166578]: 1 g | INTRAVENOUS | @ 07:00:00 | Stop: 2022-10-30 | NDC 00264440054

## 2022-10-30 MED ADMIN — HYDROCODONE-ACETAMINOPHEN 5-325 MG PO TAB [34505]: 1 | ORAL | @ 08:00:00 | Stop: 2022-10-30 | NDC 60687039611

## 2022-10-30 MED ADMIN — PROCHLORPERAZINE EDISYLATE 5 MG/ML IJ SOLN [6580]: 10 mg | INTRAVENOUS | @ 07:00:00 | Stop: 2022-10-30 | NDC 23155029431

## 2022-10-30 MED ADMIN — ACETAMINOPHEN 325 MG PO TAB [101]: 650 mg | ORAL | @ 08:00:00 | Stop: 2022-10-30 | NDC 00904677361

## 2022-10-30 NOTE — ED Notes
ED Initial Provider Note:    This patient was seen in the ED triage area to initiate and expedite the patients ED care when possible.    ED Chief Complaint:   Chief Complaint   Patient presents with    Headache     No hx of migraines, HA for last 4 days, +blurry vision, taking tylenol at home, denies URI s/sx, denies any trauma, denies numbness or tingling       S: [name] is a 37 y.o. female who presents to the Emergency Department for HA x 4 days. Took Tylenol without relief. No hx of migraines. Endorses n/v, trouble focusing. Denies URI symptoms.     PMHx:  Medical History:   Diagnosis Date    Gastrointestinal disorder     GERD    MRSA (methicillin resistant Staphylococcus aureus) ~2014    right great toenail, (1st negative 01/07/18 [place])    Other nonspecific abnormal finding     PCOS (polycystic ovarian syndrome)     Seizure (HCC)     stress-induced; non-epileptic    Tachycardia 2008    r/t anxiety/panic attack    Unspecified cardiovascular disease     Unspecified mental or behavioral problem        Pulse 90  - Ht 170.2 cm (5\' 7" )  - LMP 04/30/2019  - SpO2 99%  - BMI 30.53 kg/m    O: Brief Physical: Pt is a&ox3 and ambulatory. No respiratory distress.     A/P: The patient was seen by me as an initial provider in triage. A brief history and physical was obtained. My exam is intended to be an initial medial screening exam. Initial orders have been placed by me. My working diagnosis is HA, viral illness.    The patient is deemed appropriate for the main ED. The patient's care will be resumed by the ED provider care team once the patient is roomed in the ED. A more detailed / complete H&P will be documented by those providers.

## 2022-12-19 ENCOUNTER — Encounter: Admit: 2022-12-19 | Discharge: 2022-12-19

## 2022-12-19 DIAGNOSIS — M25561 Pain in right knee: Secondary | ICD-10-CM

## 2022-12-27 ENCOUNTER — Encounter: Admit: 2022-12-27 | Discharge: 2022-12-27

## 2022-12-27 ENCOUNTER — Ambulatory Visit: Admit: 2022-12-27 | Discharge: 2022-12-27 | Payer: MEDICAID

## 2022-12-27 ENCOUNTER — Ambulatory Visit: Admit: 2022-12-27 | Discharge: 2022-12-27

## 2022-12-27 DIAGNOSIS — R000 Tachycardia, unspecified: Secondary | ICD-10-CM

## 2022-12-27 DIAGNOSIS — I2510 Atherosclerotic heart disease of native coronary artery without angina pectoris: Secondary | ICD-10-CM

## 2022-12-27 DIAGNOSIS — S83241A Other tear of medial meniscus, current injury, right knee, initial encounter: Secondary | ICD-10-CM

## 2022-12-27 DIAGNOSIS — R569 Unspecified convulsions: Secondary | ICD-10-CM

## 2022-12-27 DIAGNOSIS — K929 Disease of digestive system, unspecified: Secondary | ICD-10-CM

## 2022-12-27 DIAGNOSIS — R6889 Other general symptoms and signs: Secondary | ICD-10-CM

## 2022-12-27 DIAGNOSIS — E282 Polycystic ovarian syndrome: Secondary | ICD-10-CM

## 2022-12-27 DIAGNOSIS — M25561 Pain in right knee: Secondary | ICD-10-CM

## 2022-12-27 DIAGNOSIS — IMO0002 Unspecified mental or behavioral problem: Secondary | ICD-10-CM

## 2022-12-27 DIAGNOSIS — A4902 Methicillin resistant Staphylococcus aureus infection, unspecified site: Secondary | ICD-10-CM

## 2022-12-27 NOTE — Patient Instructions
Please do not hesitate to contact my office with any questions.      Michael W Perry, MD - Orthopedic Surgeon  [facility] - Phone [phone] - Fax [phone]  [address], [address] - [place],  [address]     Dr Perry's clinic phone line: [phone] - this phone is not physically able to be answered every day, but voicemail is checked regularly and responded to as quickly as possible.     Preferred contact for non-urgent clinical questions: MyChart     For follow up appointments, please call [phone]     If you are opting to use any of the [place] physical therapy locations, these are the following numbers you can contact to schedule.  [facility]- [phone]  [place]- [phone]   [facility]- [phone]      If you have been instructed to schedule imaging with radiology, please call [phone].     Elissia Hernandez, RN

## 2022-12-27 NOTE — Progress Notes
Orthopaedic Surgery History and Physical - Marvene Staff, MD    Referring Provider: Basilio Cairo    Date of Visit: 12/27/2022       CHIEF COMPLAINT: Right knee pain     HISTORY OF PRESENT ILLNESS: [name] is a 37 y.o. female who presents with right knee pain. She states the knee pain has been ongoing for over a year. She underwent a right knee meniscal repair by Dr. Gwenith Daily in February 2023. She states her pain never improved from the surgery. She continues to complain of medial sided pain with walking, stairs, and other functional activities. She complains of catching and locking. She wears a brace for stability. She is unable to take NSAIDs due to previous gastric bypass. She reports a previous right knee CSI last year without any relief. She denies any recent physical therapy. She states her pain is worse with activity and better with rest. She denies any radicular pain. She did have a new right knee MRI done in April and she states Dr. Gwenith Daily had discussed proceeding with a menisectomy.       Smoker: Yes  Diabetes: Denies  History of MI: Denies  History of Stroke: Denies  History of DVT: Denies        Deep Vein Thrombosis (DVT) Risk Assessment       Diagnosis of Malignancy, not in remission            No          Rheumatoid Arthritis       No      Established history of thrombophilia (Factor V Leiden disease, Antiphospholipid syndrome, lupus anticoagulant, elevated serum homocysteine, G20210A prothrombin mutation)    No     History of venous thromboembolism (VTE)           No     Family history of VTE (1st degree relative)            No    REVIEW OF SYSTEMS:   Review of Systems    PAST MEDICAL HISTORY:   Past Medical History:   Diagnosis Date    Gastrointestinal disorder     GERD    MRSA (methicillin resistant Staphylococcus aureus) ~2014    right great toenail, (1st negative 01/07/18 [place])    Other nonspecific abnormal finding     PCOS (polycystic ovarian syndrome)     Seizure (HCC)     stress-induced; non-epileptic    Tachycardia 2008    r/t anxiety/panic attack    Unspecified cardiovascular disease     Unspecified mental or behavioral problem        PAST SURGICAL HISTORY:   Surgical History:   Procedure Laterality Date    HX CHOLECYSTECTOMY  11/2016    self reported    LAPAROSCOPIC ROUX-EN-Y GASTROENTEROSTOMY WITH GASTRIC BYPASS AND SMALL INTESTINE RECONSTRUCTION LESS THAN 150 CM N/A 01/20/2018    Performed by Bufford Lope, MD at [facility]    ESOPHAGOGASTRODUODENOSCOPY WITH SPECIMEN COLLECTION BY BRUSHING/ WASHING N/A 01/20/2018    Performed by Bufford Lope, MD at [facility]    ESOPHAGOGASTRODUODENOSCOPY WITH DILATION N/A 02/24/2018    Performed by Bufford Lope, MD at [facility]    ESOPHAGOGASTRODUODENOSCOPY WITH SPECIMEN COLLECTION BY BRUSHING/ WASHING N/A 03/31/2018    Performed by Bufford Lope, MD at [facility]    DIAGNOSTIC LAPAROSCOPY with closure of internal hernia N/A 09/12/2021    Performed by Bufford Lope, MD at [facility]  ESOPHAGOGASTRODUODENOSCOPY N/A 09/12/2021    Performed by Bufford Lope, MD at [facility]    DILATION AND CURETTAGE      OVARIAN CYST REMOVAL      UPPER GASTROINTESTINAL ENDOSCOPY         FAMILY HISTORY:   Family History   Problem Relation Name Age of Onset    Hypertension Mother      Diabetes Mother      Diabetes Father      Epilepsy Father      Epilepsy Sister         SOCIAL HISTORY:  reports that she has been smoking cigarettes. She started smoking about 18 years ago. She has a 15 pack-year smoking history. She has never used smokeless tobacco. She reports that she does not drink alcohol and does not use drugs.    MEDICATIONS:   Current Outpatient Medications:     cholecalciferol (VITAMIN D-3) 1,000 units tablet, Take one tablet by mouth daily., Disp: , Rfl:     cyanocobalamin (vitamin B-12) (CYANOCOBALAMIN(DIL) (VITAMIN B-12)) 100 mcg/mL, Inject  into the muscle., Disp: , Rfl:     omeprazole DR (PRILOSEC) 40 mg capsule, Take one capsule by mouth twice daily. Open capsule and mix in applesauce.  Take twice daily., Disp: 180 capsule, Rfl: 1    ondansetron (ZOFRAN ODT) 4 mg rapid dissolve tablet, Dissolve one tablet by mouth every 8 hours as needed for Nausea or Vomiting. Place on tongue to dissolve., Disp: 60 tablet, Rfl: 0    sucralfate (CARAFATE) 1 gram tablet, Take one tablet by mouth every 6 hours. Take on an empty stomach. Make into slurry, adding enough water to make a syrup like substance., Disp: 360 tablet, Rfl: 0    ALLERGIES:   Allergies   Allergen Reactions    Mushroom ANAPHYLAXIS    Asa [Aspirin] STOMACH UPSET     Roux-En-Y on 01/20/18.    Coconut HIVES and RASH    Nsaids (Non-Steroidal Anti-Inflammatory Drug) STOMACH UPSET     Roux-En-Y on 01/20/18.    Prozac [Fluoxetine] HIVES    Sulfa (Sulfonamide Antibiotics) RASH    Yellow Dye HIVES and RASH    Pristiq [Desvenlafaxine Succinate] UNKNOWN       PHYSICAL EXAM:  There were no vitals filed for this visit.    Musculoskeletal: Exam of Right Lower Extremity  - No open lesions, no erythema, no prior incisions about the right knee  - Knee ROM: 0-110  - No palpable effusion  - Knee Stability: Stable to varus stress, stable to valgus stress at 30 degrees of flexion. Stable anterior and posterior drawer.  - Normal strength with hip flexion, knee extension, knee flexion. Fires TA, EHL, FHL, GSC  - Tenderness to palpation along medial joint line  - McMurray's Examination is positive   - Stinchfield: negative   - Logroll: negative   - Straight leg raise: negative   - Feet warm and well perfused.  - palpable pedal pulses    Constitutional: Alert, awake, and in no acute distress  HEENT: Normocephalic, atraumatic. Cranial nerves II-XII grossly intact. Sclera anicteric.  Respiratory: Breathing unlabored, no wheezing.  Appropriate rise & fall with inspiration/expiration  Cardiovascular: Regular rate.  No clubbing, cyanosis, or edema peripherally.  Skin: No rashes. No ulcers. No open wounds.  Neurological: Sensation intact to light touch in the L4 - S1 distrubition.   Psychiatric: Mood is appropriate.  Judgement and insight seems appropriate.      IMAGING: Imaging reviewed by physician  X-rays of the right knee independently interpreted and reviewed today demonstrate mild medial  joint line narrowing with a lateral osteophyte noted. There are no acute fracture identified.     MRI right knee was done at an outside facility which was reviewed today and reveal post surgical changes of the posterior horn of the medial meniscus and minimal degenerative changes of the medial compartment.      _______________________________________________    ASSESSMENT/DIAGNOSIS:   Right Knee Pain     PLAN:   I had a long discussion with the patient regarding the etiology and treatment for the right knee. We discussed all the options including conservative management, medication, physical therapy, weight loss, bracing, and injection versus operative treatment. Risks, benefits, and potential complications of the variety of treatment options including infection, neurovascular injury, stiffness, wear and longevity of the implant, revision, loosening, fracture, manipulation under anesthesia, clot (DVT, PE), fat embolism, bleeding, transfusion, anesthesia, MI, stroke, and death were discussed.     We reviewed her xrays ,MRI, clinical exam, and discussed her treatment options today. We discussed with [name] that she is not a candidate for a knee replacement. We would recommend Tylenol, physical therapy, and injections. We will place a referral to one of our sports medicine surgeons for further treatment of her right knee pain and possible meniscus tear. We will provide her a prescription for physical therapy. She may follow up with [place] as needed. All of her questions were answered today.      ________________________________________________       Marvene Staff, MD  Orthopedic Surgery

## 2023-01-02 ENCOUNTER — Encounter: Admit: 2023-01-02 | Discharge: 2023-01-02

## 2023-01-09 ENCOUNTER — Encounter: Admit: 2023-01-09 | Discharge: 2023-01-09

## 2023-01-10 ENCOUNTER — Encounter: Admit: 2023-01-10 | Discharge: 2023-01-10

## 2023-01-10 ENCOUNTER — Ambulatory Visit: Admit: 2023-01-10 | Discharge: 2023-01-11 | Payer: MEDICAID

## 2023-01-10 DIAGNOSIS — K929 Disease of digestive system, unspecified: Secondary | ICD-10-CM

## 2023-01-10 DIAGNOSIS — E282 Polycystic ovarian syndrome: Secondary | ICD-10-CM

## 2023-01-10 DIAGNOSIS — I2510 Atherosclerotic heart disease of native coronary artery without angina pectoris: Secondary | ICD-10-CM

## 2023-01-10 DIAGNOSIS — R569 Unspecified convulsions: Secondary | ICD-10-CM

## 2023-01-10 DIAGNOSIS — IMO0002 Unspecified mental or behavioral problem: Secondary | ICD-10-CM

## 2023-01-10 DIAGNOSIS — Z9884 Bariatric surgery status: Secondary | ICD-10-CM

## 2023-01-10 DIAGNOSIS — A4902 Methicillin resistant Staphylococcus aureus infection, unspecified site: Secondary | ICD-10-CM

## 2023-01-10 DIAGNOSIS — R000 Tachycardia, unspecified: Secondary | ICD-10-CM

## 2023-01-10 DIAGNOSIS — R6889 Other general symptoms and signs: Secondary | ICD-10-CM

## 2023-01-10 NOTE — Progress Notes
CC: Morbid obesity, status post  L-RYGB    Subjective:   [name] is a 37 y.o. status post above.  Patient reports she has had mulitple ER visits recently.  Notes abdominal pain.  States she went to the ER the night before last and is now concerned because her CT scan report states her bladder was decompressed which she felt was a pathologic finding.  She reports she has abdominal pain with eating.  She reports she quit smoking.   But she is now vaping and reports vaping nicotine.  We discussed need for absolute smoking cessation.   Patient with known prior history of marginal ulcer and we have discussed given her epigastric pain symptomatology consistent with likely marginal ulcer, combined with smoking/tobacco use, that the treatment is nonoperative and requires tobacco cessation as well as PPI and carafate use.    PE:   Vitals:    01/10/23 0913   BP: 114/62   Pulse: 65   Temp: 36.3 ?C (97.4 ?F)   SpO2: 98%   PainSc: Eight   Weight: 89.5 kg (197 lb 6.4 oz)   Height: 167.6 cm (5' 6)       Body mass index is 31.86 kg/m?.        Gen:  A/Ox3, NAD  HEENT:  EOMI, sclera anicteric  Chest:  nonlabored, able to speak in complete sentences without difficulty  Derm:  No rashes apparent      Assessment/Plan:  37 y.o. female s/p L-RYGB with epigastric abdominal pain consistent with marginal ulcer.  I congratulated [name] on her smoking cessation but again reviewed the need for cessation of all nicotine-containing products given the ongoing risk of marginal ulcer.  Continue PPI and carafate.       Total time 15 minutes.

## 2023-01-24 ENCOUNTER — Encounter: Admit: 2023-01-24 | Discharge: 2023-01-24

## 2023-01-29 ENCOUNTER — Encounter: Admit: 2023-01-29 | Discharge: 2023-01-29

## 2023-02-01 ENCOUNTER — Encounter: Admit: 2023-02-01 | Discharge: 2023-02-01

## 2023-02-18 ENCOUNTER — Encounter: Admit: 2023-02-18 | Discharge: 2023-02-18

## 2023-02-19 ENCOUNTER — Encounter: Admit: 2023-02-19 | Discharge: 2023-02-19

## 2023-02-20 ENCOUNTER — Encounter: Admit: 2023-02-20 | Discharge: 2023-02-20

## 2023-03-06 ENCOUNTER — Encounter: Admit: 2023-03-06 | Discharge: 2023-03-06

## 2023-03-25 ENCOUNTER — Encounter: Admit: 2023-03-25 | Discharge: 2023-03-25

## 2023-03-26 ENCOUNTER — Encounter: Admit: 2023-03-26 | Discharge: 2023-03-26

## 2023-03-26 NOTE — Telephone Encounter
I received a voicemail yesterday from the patient stating she is needing some 'diflucan' for a yeast infection on lower portion of her stomach. I called patient back. She reports having a lot of pain caused by the potential yeast infection to her stomach. I advised patient to follow up with PCP first, and if they feel it is a bariatric problem to give [place] a call back. Patient verbalized understanding and stated she is seeing her PCP on Friday this week.

## 2023-08-22 ENCOUNTER — Encounter: Admit: 2023-08-22 | Discharge: 2023-08-22

## 2023-11-08 ENCOUNTER — Encounter: Admit: 2023-11-08 | Discharge: 2023-11-08 | Payer: MEDICAID

## 2023-11-09 ENCOUNTER — Encounter: Admit: 2023-11-09 | Discharge: 2023-11-09 | Payer: MEDICAID

## 2023-11-09 MED ORDER — POLYETHYLENE GLYCOL 3350 17 GRAM PO PWPK
1 | Freq: Every day | ORAL | 0 refills | Status: DC | PRN
Start: 2023-11-09 — End: 2023-11-11

## 2023-11-09 MED ORDER — SUCRALFATE 1 GRAM PO TAB
1 g | ORAL | 0 refills | Status: DC
Start: 2023-11-09 — End: 2023-11-11
  Administered 2023-11-10 – 2023-11-11 (×6): 1 g via ORAL

## 2023-11-09 MED ORDER — NICOTINE (POLACRILEX) 4 MG BU GUM
4 mg | BUCCAL | 0 refills | Status: DC | PRN
Start: 2023-11-09 — End: 2023-11-11
  Administered 2023-11-10 – 2023-11-11 (×8): 4 mg via BUCCAL

## 2023-11-09 MED ORDER — HYDROXYZINE HCL 25 MG PO TAB
25 mg | ORAL | 0 refills | Status: DC | PRN
Start: 2023-11-09 — End: 2023-11-11
  Administered 2023-11-10 (×2): 25 mg via ORAL

## 2023-11-09 MED ORDER — MELATONIN 3 MG PO TAB
6 mg | Freq: Every evening | ORAL | 0 refills | Status: DC
Start: 2023-11-09 — End: 2023-11-11
  Administered 2023-11-10 – 2023-11-11 (×2): 6 mg via ORAL

## 2023-11-09 MED ORDER — CHOLECALCIFEROL (VITAMIN D3) 25 MCG (1,000 UNIT) PO TAB
1000 [IU] | Freq: Every day | ORAL | 0 refills | Status: DC
Start: 2023-11-09 — End: 2023-11-11
  Administered 2023-11-10 – 2023-11-11 (×2): 1000 [IU] via ORAL

## 2023-11-09 MED ORDER — LORAZEPAM 2 MG/ML IJ SOLN GROUP
2 mg | INTRAMUSCULAR | 0 refills | Status: DC | PRN
Start: 2023-11-09 — End: 2023-11-11

## 2023-11-09 MED ORDER — ACETAMINOPHEN 500 MG PO TAB
1000 mg | ORAL | 0 refills | Status: DC | PRN
Start: 2023-11-09 — End: 2023-11-11
  Administered 2023-11-11: 20:00:00 1000 mg via ORAL

## 2023-11-09 MED ORDER — HALOPERIDOL 5 MG PO TAB
5 mg | ORAL | 0 refills | Status: DC | PRN
Start: 2023-11-09 — End: 2023-11-11

## 2023-11-09 MED ORDER — HALOPERIDOL LACTATE 5 MG/ML IJ SOLN
5 mg | INTRAMUSCULAR | 0 refills | Status: DC | PRN
Start: 2023-11-09 — End: 2023-11-11

## 2023-11-09 MED ORDER — ACETAMINOPHEN 325 MG PO TAB
650 mg | ORAL | 0 refills | Status: DC | PRN
Start: 2023-11-09 — End: 2023-11-09

## 2023-11-09 MED ORDER — NICOTINE 21 MG/24 HR TD PT24
1 | Freq: Every day | TRANSDERMAL | 0 refills | Status: DC
Start: 2023-11-09 — End: 2023-11-11
  Administered 2023-11-10 – 2023-11-11 (×2): 1 via TRANSDERMAL

## 2023-11-09 MED ORDER — PANTOPRAZOLE 40 MG PO TBEC
40 mg | Freq: Two times a day (BID) | ORAL | 0 refills | Status: DC
Start: 2023-11-09 — End: 2023-11-11
  Administered 2023-11-10 – 2023-11-11 (×4): 40 mg via ORAL

## 2023-11-09 MED ORDER — CALCIUM CARBONATE 200 MG CALCIUM (500 MG) PO CHEW
1000 mg | Freq: Three times a day (TID) | ORAL | 0 refills | Status: DC | PRN
Start: 2023-11-09 — End: 2023-11-11

## 2023-11-09 MED ORDER — TRAZODONE 50 MG PO TAB
50 mg | Freq: Every evening | ORAL | 0 refills | Status: DC | PRN
Start: 2023-11-09 — End: 2023-11-11

## 2023-11-09 NOTE — Care Plan
 Rates anxiety and depression as 6. Reports SI, contracts for safety. Requesting her Seroquel Q HS and Lamictal. Very much focused on meds  Problem: Harm to self, high risk of suicide  Goal: Absence of Harm to Self  Outcome: Goal Ongoing  Flowsheets (Taken 11/09/2023 2153)  Absence of harm to self:   Assess and report significant changes in mood and affect   Complete the environmental risk assessment   Reassess for suicide risk daily     Problem: Mood - Altered  Goal: Stabilize mood  Outcome: Goal Ongoing  Flowsheets (Taken 11/09/2023 2153)  Stabilize mood: Assess depressive symptoms     Problem: Thought Process - Altered  Goal: Demonstration of organized thought processes  Outcome: Goal Ongoing  Flowsheets (Taken 11/09/2023 2153)  Demonstration of organized thought processes:   Assess thought process   Assess in reality orientation

## 2023-11-09 NOTE — Progress Notes
 N2N Report:   Name/Title/Phone Number of person providing N2N Information:    [phone]  Tanya     Location of Patient:  [facility]    Allergies Mushroom, ASA, Coconut, Nsaids, Prozac, Sulfa, Yellow Dye, Pristiq      Most Recent Vitals:  BP 109/43    HR    68 RR   18  SPO2   97%  Temp   36.3    Recent blood sugar and time obtained, if Diabetic  NA  Pain  Rt knee, medicated         Current Psychiatric Symptoms:  38 y/o female presents to the ED for the second time from [facility] a substance abuse center for SI, facility reports that she attempted to drown herself in the the bathtub, she reports that she just told them that she was thinking of it. 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 She does endorse SI and wants to get her mental health stabilized so she can go through substance treatment. They did find a crack pipe in her purse. She does have a history of seizures.    Psychosocial (Voluntary Status, DPOA/Guardian, Social Support, Current Living Situation, etc.):  Voluntary, homeless     Medical Interventions Needed or PRN's:  Tylenol 1 gm po     Medical Issues and Diagnosis:  Gastric sleeve (history)  Seizures, anxiety induced  PTSD  BPD  Depression    Medications and Compliance:  Fe supplement  Vitamin D  Folic acid   Abilify  Lexapro  Hydroxyzine  Meloxicam  Omeprazole  Seroquel  Lamotrigine      A&O x4,calm and cooperative  Eating/Drinking  Yes   Ambulating  Yes  Urinating  Yes  Skin concerns  none noted          Labs Completed   Hcg   Negative  UDS    Negative  BAL < 11  Alk phos    143        Additional Information: states that she has a fractured R patella and she is wearing a brace.       ETA?  Pending

## 2023-11-09 NOTE — Progress Notes
 NURSING ADMISSION NOTE    Patient presents to [facility] from [facility] via [facility].  Skin assessment completed by Steven Elam, RN with Intake  as witness.    Contraband/Body Checks  Patient Searched: Yes  Belongings Searched: Yes  Head Lice Check: Yes    Reason for admission:  Suicide Ideation.    Patient rates anxiety 5 on 0 - 10 scale and depression 6 on 0 - 10 scale.      Mental Status Exam  Legal Status: Voluntary Admission  General Appearance: Avoids eye contact, Poor hygiene  Mood / Affect: Depressed mood  Speech: Normal  Content Of Thought: Suicidal Thoughts  Motor Activity: Unusual gait  Flow of Thought: Flight of ideas, Linear  Insight / Judgment: Fair judgment, Fair insight  Behavior: Cooperative  Patient Strengths: Knowledge of medications    Vitals  BP: 134/78 (04/26 1600)  Temp: 36.6 ?C (97.9 ?F) (04/26 1600)  Pulse: 84 (04/26 1600)  Respirations: 18 PER MINUTE (04/26 1600)  SpO2: 99 % (04/26 1600)  O2 Device: None (Room air) (04/26 1600)  Height: 170.2 cm (5' 7) (04/26 1600)    Substance Abuse History:  Social History     Tobacco Use    Smoking status: Every Day     Current packs/day: 0.00     Average packs/day: 1 pack/day for 15.0 years (15.0 ttl pk-yrs)     Types: Cigarettes     Start date: 12/28/2004     Last attempt to quit: 12/29/2019     Years since quitting: 3.8    Smokeless tobacco: Never    Tobacco comments:     Daily Tobacco Vape   Vaping Use    Vaping status: Some Days    Substances: Nicotine, Flavoring   Substance Use Topics    Alcohol use: No     Comment: none    Drug use: Yes     Types: Methamphetamines, Cocaine     Comment: 1 week ago sunday       AUDIT-C  How Often Drink w/ Alcohol?: Never  # Drinks w/ Alcohol In Typical Day?: 0 drinks  How Often 6+ Drinks Per Occasion?: Never  AUDIT-C Total Score: 0  Patient Unable to Be Screened: N/A    CAIGE AID  Have you ever felt you ought to cut down on your drinking or drug use?: No  Have people annoyed you by criticizing your drinking or drug use?: No  Have you felt bad or guilty about your drinking or drug use?: No  Have you ever had a drink or used drugs first thing in the morning to steady your nerves or to get rid of a hangover (eye-opener)?: No  Total Score: 0    Past Medical History:    ADHD (attention deficit hyperactivity disorder)    Anxiety    Bladder infection, chronic    Degenerative disc disease, lumbar    Degenerative disc disease, thoracic    Depression    Gastrointestinal disorder    Generalized headaches    GERD (gastroesophageal reflux disease)    Heart murmur    Infection with methicillin-resistant Staphylococcus aureus (MRSA)    Learning disorder    MRSA (methicillin resistant Staphylococcus aureus)    Nausea    Osteoarthritis    Osteoporosis    Other nonspecific abnormal finding    PCOS (polycystic ovarian syndrome)    Seizure (CMS-HCC)    Stomach ulcer    Tachycardia    Unspecified cardiovascular disease  Unspecified mental or behavioral problem       Surgical History:   Procedure Laterality Date    HX CHOLECYSTECTOMY  11/2016    self reported    LAPAROSCOPIC ROUX-EN-Y GASTROENTEROSTOMY WITH GASTRIC BYPASS AND SMALL INTESTINE RECONSTRUCTION LESS THAN 150 CM N/A 01/20/2018    Performed by Teddie Favre, MD at [facility]    ESOPHAGOGASTRODUODENOSCOPY WITH SPECIMEN COLLECTION BY BRUSHING/ WASHING N/A 01/20/2018    Performed by Teddie Favre, MD at [facility]    ESOPHAGOGASTRODUODENOSCOPY WITH DILATION N/A 02/24/2018    Performed by Teddie Favre, MD at [facility]    ESOPHAGOGASTRODUODENOSCOPY WITH SPECIMEN COLLECTION BY BRUSHING/ WASHING N/A 03/31/2018    Performed by Teddie Favre, MD at [facility]    DIAGNOSTIC LAPAROSCOPY with closure of internal hernia N/A 09/12/2021    Performed by Teddie Favre, MD at [facility]    ESOPHAGOGASTRODUODENOSCOPY N/A 09/12/2021    Performed by Teddie Favre, MD at [facility]    DILATION AND CURETTAGE      HX ARTHROSCOPIC SURGERY      KNEE SURGERY  08/22/2021    OVARIAN CYST REMOVAL      PR LAPAROSCOPY SURG RPR INITIAL INGUINAL HERNIA      UPPER GASTROINTESTINAL ENDOSCOPY         Medication reconciliation/preferred outpatient pharmacy:    [id number] Deyanne Forester, [place] - [address] AT [facility] OF [address]  Phone: [phone] Fax: [phone]    Medications Prior to Admission   Medication Sig Dispense Refill Last Dose/Taking    cholecalciferol (VITAMIN D-3) 1,000 units tablet Take one tablet by mouth daily.   Past Month    cyanocobalamin (vitamin B-12) (CYANOCOBALAMIN(DIL) (VITAMIN B-12)) 100 mcg/mL Inject  into the muscle.   11/09/2023    escitalopram oxalate (LEXAPRO) 10 mg tablet Take one tablet by mouth daily.   11/09/2023    lamoTRIgine (LAMICTAL) 25 mg tablet Take two tablets by mouth daily. Hs   11/08/2023 Bedtime    meloxicam (MOBIC) 15 mg tablet Take one tablet by mouth daily. daily   11/09/2023    omeprazole DR (PRILOSEC) 40 mg capsule Take one capsule by mouth twice daily. Open capsule and mix in applesauce.  Take twice daily. 180 capsule 1 11/09/2023    ondansetron (ZOFRAN ODT) 4 mg rapid dissolve tablet Dissolve one tablet by mouth every 8 hours as needed for Nausea or Vomiting. Place on tongue to dissolve. 60 tablet 0 Past Month    QUEtiapine (SEROQUEL) 25 mg tablet Take one tablet by mouth daily. Indications: bipolar disorder in remission   11/09/2023 Morning    QUEtiapine (SEROQUEL) 50 mg tablet Take one tablet by mouth at bedtime daily.   11/08/2023 Bedtime    sucralfate (CARAFATE) 1 gram tablet Take one tablet by mouth every 6 hours. Take on an empty stomach. Make into slurry, adding enough water to make a syrup like substance. 360 tablet 0 Unknown       Patient is very upset came to unit with right knee brace on outside of clothing and state that her patella was broken and she couldn't take off the brace-Patient is alert and requires encouraged to follow up with staff when having issues with depression or anxiety when feeling stress needs to notify staff that she is having some issues and concerns

## 2023-11-09 NOTE — Progress Notes
 Pt arrived to unit at 1710 accompanied by Dasha, [facility] intake. Pt calm, cooperative. Denies SI, HI, AVH. Endorses anxiety 8, depression 8. Oriented to person, place, time, situation. Orientation provided to unit and program with understanding voiced. Pt present in milieu and interacting appropriately with peers and staff.

## 2023-11-09 NOTE — Care Plan
 Problem: Mood - Altered  Goal: Stabilize mood  Outcome: Goal Ongoing  Goal: Knowledge of Altered Mood  Outcome: Goal Ongoing     Problem: Thought Process - Altered  Goal: Demonstration of organized thought processes  Outcome: Goal Ongoing     Problem: Violence, self/other-directed, Risk of  Goal: Absence of violence  Outcome: Goal Ongoing  Goal: Knowledge of risk for violence, self/other-directed  Outcome: Goal Ongoing     Problem: Violence, self/other-directed, Risk of  Goal: Absence of violence  Outcome: Goal Ongoing  Goal: Knowledge of risk for violence, self/other-directed  Outcome: Goal Ongoing     Problem: Transition Readiness  Goal: Knowledge of transition readiness  Outcome: Goal Ongoing

## 2023-11-10 ENCOUNTER — Encounter: Admit: 2023-11-10 | Discharge: 2023-11-10 | Payer: MEDICAID

## 2023-11-10 LAB — ECG 12-LEAD
P AXIS: 21 degrees
Q-T INTERVAL: 394 ms
QRS DURATION: 88 ms
QTC CALCULATION (BAZETT): 451 ms
R AXIS: -14 degrees
T AXIS: 16 degrees

## 2023-11-10 MED ORDER — MIRTAZAPINE 15 MG PO TAB
15 mg | Freq: Every evening | ORAL | 0 refills | Status: DC
Start: 2023-11-10 — End: 2023-11-11
  Administered 2023-11-11: 01:00:00 15 mg via ORAL

## 2023-11-10 MED ORDER — ONDANSETRON 4 MG PO TBDI
4 mg | ORAL | 0 refills | Status: DC | PRN
Start: 2023-11-10 — End: 2023-11-11

## 2023-11-10 MED ORDER — LAMOTRIGINE 25 MG PO TAB
25 mg | Freq: Every day | ORAL | 0 refills | Status: DC
Start: 2023-11-10 — End: 2023-11-11
  Administered 2023-11-10 – 2023-11-11 (×2): 25 mg via ORAL

## 2023-11-10 MED ORDER — ARIPIPRAZOLE 10 MG PO TAB
10 mg | Freq: Every evening | ORAL | 0 refills | Status: DC
Start: 2023-11-10 — End: 2023-11-11
  Administered 2023-11-11: 01:00:00 10 mg via ORAL

## 2023-11-10 NOTE — Consults
 General Medicine Initial Consult Note    Name: [name]        MRN: [id number]          DOB: 11/26/1985            Age: 38 y.o.  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 Last seen/discharged from ED for seizure complaint on 11/01/2023  at [facility].  This month of a prolonged, it appears she has had 3 presentations to either [facility] or.  [facility] for evaluation for seizures (no admissions).  Appears patient has been managed with Lamictal 25 mg daily  History of gastric sleeve-prior bowel regimen include Carafate 1 g 3 times daily/nightly, Prilosec 40 mg twice daily  Right patella fracture -currently wearing a brace  Obesity with a BMI of 30.28    Of note, patient is a high-volume utilizer of the [facility] and [facility] ED system.    Recommendations:  -Defer management of acute suicidal ideation with reported attempt another acute psychiatric needs to primary team.  -Agree with continuation of patient's Carafate 1 g every 6 and Protonix 40 mg twice daily.  -Agree with continuation of patient's prior to arrival Lamictal 25 mg daily and 2 mg of IM Ativan as needed for seizures lasting greater than 5 minutes or if the start of seizure is unwitnessed.  May repeat once for seizure lasting greater than 10 minutes and/or transport is not arrived.  -Agree with nicotine replacement.  -Will follow-up labs currently ordered for 4/27, 25 hydroxylase, CBC with differential, CMP, A1c, lipid profile.  -I have ordered EKG      Thank you for the consult.  We will continue to follow.    Please direct initial questions to the primary team.  General Medicine consults can be contacted via Voalte using Med Consults 1 or 2 First Call 24 hours a day  ______________________________________________________________________    Chief Complaint:    History of Present Illness: [name] is a 38 y.o. female who had the pleasure evaluate examined in this a.m.  Patient found to be in no acute distress.  Patient denied have any complaints or concerns  Other than making sure she had her GI related medications ordered for which I assured her and something for nausea.    Past Medical History:    ADHD (attention deficit hyperactivity disorder)    Anxiety    Bladder infection, chronic    Degenerative disc disease, lumbar    Degenerative disc disease, thoracic    Depression    Gastrointestinal disorder    Generalized headaches    GERD (gastroesophageal reflux disease)    Heart murmur    Infection with methicillin-resistant Staphylococcus aureus (MRSA)    Learning disorder    MRSA (methicillin resistant Staphylococcus aureus)    Nausea    Osteoarthritis  Osteoporosis    Other nonspecific abnormal finding    PCOS (polycystic ovarian syndrome)    Seizure (CMS-HCC)    Stomach ulcer    Tachycardia    Unspecified cardiovascular disease    Unspecified mental or behavioral problem     Surgical History:   Procedure Laterality Date    HX CHOLECYSTECTOMY  11/2016    self reported    LAPAROSCOPIC ROUX-EN-Y GASTROENTEROSTOMY WITH GASTRIC BYPASS AND SMALL INTESTINE RECONSTRUCTION LESS THAN 150 CM N/A 01/20/2018    Performed by Teddie Favre, MD at [facility]    ESOPHAGOGASTRODUODENOSCOPY WITH SPECIMEN COLLECTION BY BRUSHING/ WASHING N/A 01/20/2018    Performed by Teddie Favre, MD at [facility] ESOPHAGOGASTRODUODENOSCOPY WITH DILATION N/A 02/24/2018    Performed by Teddie Favre, MD at [facility]    ESOPHAGOGASTRODUODENOSCOPY WITH SPECIMEN COLLECTION BY BRUSHING/ WASHING N/A 03/31/2018    Performed by Teddie Favre, MD at [facility]    DIAGNOSTIC LAPAROSCOPY with closure of internal hernia N/A 09/12/2021    Performed by Teddie Favre, MD at [facility]    ESOPHAGOGASTRODUODENOSCOPY N/A 09/12/2021    Performed by Teddie Favre, MD at [facility]    DILATION AND CURETTAGE      HX ARTHROSCOPIC SURGERY      KNEE SURGERY  08/22/2021    OVARIAN CYST REMOVAL      PR LAPAROSCOPY SURG RPR INITIAL INGUINAL HERNIA      UPPER GASTROINTESTINAL ENDOSCOPY       Social History     Tobacco Use    Smoking status: Every Day     Current packs/day: 0.00     Average packs/day: 1 pack/day for 15.0 years (15.0 ttl pk-yrs)     Types: Cigarettes     Start date: 12/28/2004     Last attempt to quit: 12/29/2019     Years since quitting: 3.8    Smokeless tobacco: Never    Tobacco comments:     Daily Tobacco Vape   Vaping Use    Vaping status: Some Days    Substances: Nicotine, Flavoring   Substance and Sexual Activity    Alcohol use: No     Comment: none    Drug use: Yes     Types: Methamphetamines, Cocaine     Comment: 1 week ago sunday           Family history reviewed; non-contributory    Allergies:  Mushroom, Asa [aspirin], Coconut, Nsaids (non-steroidal anti-inflammatory drug), Prozac [fluoxetine], Sulfa (sulfonamide antibiotics), Yellow dye, and Pristiq [desvenlafaxine succinate]    Scheduled Meds:ARIPiprazole (ABILIFY) tablet 10 mg, 10 mg, Oral, QHS  CHOLEcalciferoL (vitamin D3) tablet 1,000 Units, 1,000 Units, Oral, QDAY  lamoTRIgine (LaMICtal) tablet 25 mg, 25 mg, Oral, QDAY  melatonin (MELATIN) tablet 6 mg, 6 mg, Oral, QHS  mirtazapine (REMERON) tablet 15 mg, 15 mg, Oral, QHS  nicotine (NICODERM CQ) 21 mg/day patch 1 patch, 1 patch, Transdermal, QDAY  pantoprazole DR (PROTONIX) tablet 40 mg, 40 mg, Oral, BID(11-21)  sucralfate (CARAFATE) tablet 1 g, 1 g, Oral, Q6H    Continuous Infusions:  PRN and Respiratory Meds:acetaminophen Q6H PRN, calcium carbonate TID PRN, haloperidoL Q6H PRN **OR** haloperidol lactate Q6H PRN, hydrOXYzine HCL Q6H PRN, LORazepam  (ATIVAN)  injection PRN, nicotine polacrilex Q2H PRN, polyethylene glycol 3350 QDAY PRN, traZODone QHS PRN    Review of Systems:  A ROS was performed and noted in the HPI.  All other systems negative.    Vital Signs:  Last  Filed in 24 hours Vital Signs:  24 hour Range    BP: 102/52 (04/27 0800)  Temp: 36.4 ?C (97.5 ?F) (04/27 0800)  Pulse: 59 (04/27 0800)  Respirations: 18 PER MINUTE (04/27 0800)  SpO2: 99 % (04/27 0800)  O2 Device: None (Room air) (04/26 1600)  Height: 170.2 cm (5' 7) (04/26 1600) BP: (102-134)/(52-78)   Temp:  [36.4 ?C (97.5 ?F)-37.7 ?C (99.9 ?F)]   Pulse:  [59-93]   Respirations:  [18 PER MINUTE]   SpO2:  [99 %]   O2 Device: None (Room air)     Physical Exam:  General:  Alert, cooperative, no distress, appears stated age  Eyes:  Conjunctivae/corneas clear.  PERRL, EOMs intact.  Fundi benign  Throat: Lips, mucosa and tongue normal.  Teeth and gums normal  Lungs:  Clear to auscultation bilaterally  Heart:   Regular rate and rhythm, S1, S2 normal, no murmur, click rub or gallop  Abdomen:  Soft, non-tender.  Bowel sounds normal.  No masses.  No organomegaly.    Lab/Radiology/Other Diagnostic Tests:  24-hour labs:  No results found for this visit on 11/09/23 (from the past 24 hours).  EKG Reviewed      Total Time Today was 60 minutes in the following activities: Preparing to see the patient, Obtaining and/or reviewing separately obtained history, Performing a medically appropriate examination and/or evaluation, Counseling and educating the patient/family/caregiver, Ordering medications, tests, or procedures, Referring and communication with other health care professionals (when not separately reported), Documenting clinical information in the electronic or other health record, Independently interpreting results (not separately reported) and communicating results to the patient/family/caregiver, and Care coordination (not separately reported)     Linzie Rickers, MD

## 2023-11-10 NOTE — Care Plan
 Rates anxiety and depression as 5. Denies SI/HI/AVH. Little bit demanding and entitled. In bed at shift change  Problem: Mood - Altered  Goal: Stabilize mood  Outcome: Goal Ongoing  Flowsheets (Taken 11/10/2023 2127)  Stabilize mood: Assess depressive symptoms     Problem: Thought Process - Altered  Goal: Demonstration of organized thought processes  Outcome: Goal Ongoing  Flowsheets (Taken 11/10/2023 2127)  Demonstration of organized thought processes:   Assess in reality orientation   Assess disturbed thought process signs and symptoms     Problem: Violence, self/other-directed, Risk of  Goal: Absence of violence  Outcome: Goal Ongoing  Flowsheets (Taken 11/10/2023 2127)  Absence of violence:   Suicide assessment   Violent behavior risk assessment   Environmental safety management   Supervise medication intake

## 2023-11-10 NOTE — Progress Notes
 Behavioral Health Services  Integrated Case Management/Therapy Assessment    NAME:[name] MRN: [id number] DOB:[birth year] AGE: 38 y.o.  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 She does endorse SI and wants to get her mental health stabilized so she can go through substance treatment. They did find a crack pipe in her purse. She does have a history of seizures.       Patient's Description of Problem: Per patient,  I feel stressed and overwhelmed with everything going on  in my life. I'm currently homeless.    Recent Changes or Stressors: housing, legal, and substance use - drugs    Guardian/Involuntary: Patient is their own guardian. Voluntary for treatment.    Living Situation: I've been homeless for a couple of weeks.  I had been staying in a sober living home, [facility] in [place] from March 7 - April 19 but left after a conflict with another housemates.      Placement upon discharge: Another sober living treatment facility     Transportation upon discharge: Transportation: Paramedic (significant others, parents, adult children, friends, spiritual community):  Name: [name]  Contact information: [phone]  Relationship: mother  ROI: Written consent obtained.    Current Mental Health and Social Services: Patient is not engaged in services at this time. Patient is interested in starting services.    Bridge Eligible:  yes/no: No    Source of Income:   Employment History (within the last year): As below.  Current Employment: n/a  Disability: 870.00  Other: n/a    Insurance: [place] Medicaid    Legal   Probation: pt reports she is currently on probation for possession of Methamphetamine, she will also need to consult with her Engineer, drilling regarding placement.  Parole: Denies  Court Date: ?      Prior Hospitalizations  Location: , [place]  Dates: over time    Prior Residential Placements  Location: Denies  Dates:     Case Management Comments: Pt will need assistance with finding placement    Trauma History: Per chart review and patient report,Pt refused to discuss    Substance Use:  Current: stimulants (meth, prescription stimulants, cocaine, crack, 'ice') (unclear)      Family History:  Family history of mental health diagnosis? father (bipolar and depression), mother (anxiety, depression, and other: Intellectual disability), and sister (anxiety, depression, and PTSD)   Family history of substance use? patient denied family history of substance use.     Internal Strengths: Some insight  External Supports: known to psychiatry staff     Limitations: homeless, impulsive, and limited supports    Therapy Aftercare Recommendations: Patient would benefit from medication management, case management, and outpatient individual counseling to address ongoing mental health concerns made worse by substance use.    Mental Status Exam    Observations:  Appearance: disheveled  Speech: WNL  Eye Contact: WNL    Behavior: Behavior: calm / cooperative    Mood: Mood: anxious  Affect: Affect: flat    Cognition:  Orientation Impairment: no noted impairment  Memory Impairment: no noted impairment    Thought Process: Thought Processes: WNL    Current Safety Concerns:   Current Safety Concerns - Self: suicidal ideation  Current Safety Concerns - Others: none  Psychoses: none  Delusions: none  Lethal Means: Patient does not report any access to firearms.    Insight: Insight: Fair  Judgment: Judgment: Poor    Therapy Comments: Patient will need to establish after can plan including ongoing therapy,, medications, follow-up and safe living arrangements.

## 2023-11-10 NOTE — Group Note
 Name: [name]   MRN: [id number]     DOB: [birth year]      Age: 38 y.o.  Admission Date: 11/09/2023     LOS: 1 day     Date of Service: 11/10/2023      Group Topic: BH Self-Care  Group Date: 11/10/2023  Start Time: 1000  End Time: 1045  Facilitators: Karlos Overlie          Number of Participants: 8  Group Focus: self-esteem  Treatment Modality: Cognitive Behavioral Therapy and Psychoeducation  Interventions utilized were  exploration, group exercise, patient education and support  Purpose: enhance coping skills, explore maladaptive thinking, express feelings, increase insight, regain self-worth, and reinforce self-care      SW facilitated the Dicebreaker Group ,where patients were instructed to roll a dice and answer a question corresponding to the number rolled.  The questions focused on self-esteem, encouraging patients to reflect on the positive aspects of their lives.  SW emphasized that self-love boosts confidence and fosters a more positive self-image.  Patient were encouraged to share their responses with the group, and time as allowed for group feedback and discussion.     Name: [name] Date of Birth: 08-11-1985   MR: [id number]      Level of Participation: active   Quality of Participation: attentive, cooperative, engaged, and offered feedback  Interactions with others: gave feedback  Mood/Affect: appropriate  Triggers (if applicable):   Cognition: coherent/clear  Progress: Gaining insight  Response: Pt was fully engaged in group  Plan: to continue treatment

## 2023-11-10 NOTE — Progress Notes
Pt refused AM lab draw.

## 2023-11-10 NOTE — Progress Notes
 1102      PRN Medication Administered:  Hydroxyzine    Non pharmacological interventions attempted prior to PRN medication administration:  Verbal de-escalation techniques and Decrease stimuli, discussed coping skills    Brief narrative of reason for PRN medication administration:   Pt rating her anxiety 6/10 and requested medication.  Pt cannot discern a reason for the increased anxiety.  Pt medicated with the above cited med as ordered and charted.

## 2023-11-10 NOTE — Group Note
 Name: [name]   MRN: [id number]     DOB: 1986-06-21      Age: 38 y.o.  Admission Date: 11/09/2023     LOS: 1 day     Date of Service: 11/10/2023      Group Topic: BH Leisure Activities  Group Date: 11/10/2023  Start Time: 1400  End Time: 1445  Facilitators: Isla Mari          Number of Participants: 8  Group Focus: communication, coping skills, leisure skills, and social skills  Treatment Modality: Drama Therapy  Interventions utilized were Group Juggle, Category [employer], and parachute  Purpose: enhance coping skills    Patients warmed-up with a game called ?Group Juggle,? where they work together to pass balls around a circle. This activity focuses on communication and teamwork. Next, patients engaged with a game called ?Category Ball Toss,? where they choose a category (i.e. colors, animals, sports) and throw a ball around the circle. If the ball is tossed to someone, they can either try to name something in that category or choose a new category. This activity focuses on executive functions, specifically metacognition (working memory and shifting attention. The therapist then discussed the benefits of play for adults with the patients, which led into a parachute intervention. The parachute intervention includes allowing clients to ask the group questions where group members can respond by running under the parachute, working together to move a ball around the parachute without dropping it, and parachute volleyball. This intervention allows the clients to engage with a child-like role as well as build group cohesion and teamwork.     Name: [name] Date of Birth: Nov 25, 1985   MR: [id number]      Level of Participation: patient not present for group   Plan: to continue treatment
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
## 2023-11-10 NOTE — Group Note
 Name: [name]   MRN: [id number]     DOB: 09/28/1985      Age: 38 y.o.  Admission Date: 11/09/2023     LOS: 1 day     Date of Service: 11/10/2023      Group Topic: BH Self-Compassion  Group Date: 11/10/2023  Start Time: 1300  End Time: 1400  Facilitators: Mella Inclan, LCSW      Objective:  Patients will enhance emotional awareness, communication skills, and coping strategies through a structured, interactive dice game.    Interventions Provided:    Facilitator introduced a therapeutic dice game where each roll corresponded to a question or prompt related to emotions, coping skills, personal strengths, or problem-solving.    Patients took turns rolling the dice and responding to the corresponding prompts.    Group discussion encouraged sharing of personal experiences, practicing emotional expression, and exploring new coping strategies.    Positive reinforcement was provided for active participation and respectful peer feedback.      Number of Participants: 10  Group Focus: acceptance, activities of daily living skills, anger management, anxiety, chemical dependency education, clarity of thought, depression, family, forgiveness, impulsivity, leisure skills, and medication education  Treatment Modality: Behavior Modification Therapy, Cognitive Behavioral Therapy, Skills Training, and Solution-Focused Therapy  Interventions utilized were assignment, clarification, exploration, group exercise, and problem solving  Purpose: enhance coping skills, explore maladaptive thinking, express feelings, express irrational fears, improve communication skills, increase insight, regain self-worth, reinforce self-care, and relapse prevention strategies        Name: [name] Date of Birth: 1985-07-31   MR: [id number]      Level of Participation: patient not present for group   Plan: to continue treatment

## 2023-11-10 NOTE — Care Plan
 Problem: Discharge Planning  Goal: Participation in plan of care  Outcome: Goal Ongoing  Goal: Knowledge regarding plan of care  Outcome: Goal Ongoing  Goal: Prepared for discharge  Outcome: Goal Ongoing     Problem: Tobacco Use  Goal: Knowledge of tobacco-use cessation methods  Outcome: Goal Ongoing     Problem: Harm to self, high risk of suicide  Goal: Absence of Harm to Self  Outcome: Goal Ongoing     Problem: Mood - Altered  Goal: Stabilize mood  Outcome: Goal Ongoing  Goal: Knowledge of Altered Mood  Outcome: Goal Ongoing     Problem: Self-esteem - Low  Goal: Demonstration of positive self-esteem  Outcome: Goal Ongoing  Goal: Knowledge of low self-esteem  Outcome: Goal Ongoing     Problem: Thought Process - Altered  Goal: Demonstration of organized thought processes  Outcome: Goal Ongoing     Problem: Violence, self/other-directed, Risk of  Goal: Absence of violence  Outcome: Goal Ongoing  Goal: Knowledge of risk for violence, self/other-directed  Outcome: Goal Ongoing     Problem: Transition Readiness  Goal: Knowledge of transition readiness  Outcome: Goal Ongoing

## 2023-11-10 NOTE — Consults
 See Previous note.     Linzie Rickers, MD

## 2023-11-11 ENCOUNTER — Encounter: Admit: 2023-11-11 | Discharge: 2023-11-11 | Payer: MEDICAID

## 2023-11-11 ENCOUNTER — Inpatient Hospital Stay
Admit: 2023-11-09 | Discharge: 2023-11-11 | Disposition: A | Discharge status: Home / Self Care | Payer: MEDICAID | Admitting: Student in an Organized Health Care Education/Training Program

## 2023-11-11 DIAGNOSIS — Z6830 Body mass index (BMI) 30.0-30.9, adult: Secondary | ICD-10-CM

## 2023-11-11 DIAGNOSIS — F4312 Post-traumatic stress disorder, chronic: Secondary | ICD-10-CM

## 2023-11-11 DIAGNOSIS — E669 Obesity, unspecified: Secondary | ICD-10-CM

## 2023-11-11 DIAGNOSIS — Z9884 Bariatric surgery status: Secondary | ICD-10-CM

## 2023-11-11 DIAGNOSIS — F332 Major depressive disorder, recurrent severe without psychotic features: Secondary | ICD-10-CM

## 2023-11-11 DIAGNOSIS — F17210 Nicotine dependence, cigarettes, uncomplicated: Secondary | ICD-10-CM

## 2023-11-11 DIAGNOSIS — Z9049 Acquired absence of other specified parts of digestive tract: Secondary | ICD-10-CM

## 2023-11-11 DIAGNOSIS — R45851 Suicidal ideations: Secondary | ICD-10-CM

## 2023-11-11 DIAGNOSIS — I2510 Atherosclerotic heart disease of native coronary artery without angina pectoris: Secondary | ICD-10-CM

## 2023-11-11 DIAGNOSIS — F411 Generalized anxiety disorder: Secondary | ICD-10-CM

## 2023-11-11 DIAGNOSIS — F1594 Other stimulant use, unspecified with stimulant-induced mood disorder: Secondary | ICD-10-CM

## 2023-11-11 DIAGNOSIS — G40909 Epilepsy, unspecified, not intractable, without status epilepticus: Secondary | ICD-10-CM

## 2023-11-11 DIAGNOSIS — F1420 Cocaine dependence, uncomplicated: Secondary | ICD-10-CM

## 2023-11-11 DIAGNOSIS — F603 Borderline personality disorder: Secondary | ICD-10-CM

## 2023-11-11 MED ORDER — HYDROXYZINE HCL 50 MG PO TAB
50 mg | ORAL | 0 refills | Status: DC | PRN
Start: 2023-11-11 — End: 2023-11-11
  Administered 2023-11-11: 15:00:00 50 mg via ORAL

## 2023-11-11 MED ORDER — ARIPIPRAZOLE 10 MG PO TAB
10 mg | ORAL_TABLET | Freq: Every evening | ORAL | 0 refills | 30.00000 days | Status: AC
Start: 2023-11-11 — End: ?
  Filled 2023-11-11: qty 30, 30d supply, fill #1

## 2023-11-11 MED ORDER — NICOTINE (POLACRILEX) 4 MG BU GUM
4 mg | CHEWING_GUM | BUCCAL | 0 refills | 28.00000 days | Status: AC | PRN
Start: 2023-11-11 — End: ?

## 2023-11-11 MED ORDER — LAMOTRIGINE 25 MG PO TAB
50 mg | Freq: Every day | ORAL | 0 refills | Status: DC
Start: 2023-11-11 — End: 2023-11-11

## 2023-11-11 MED ORDER — MIRTAZAPINE 15 MG PO TAB
15 mg | ORAL_TABLET | Freq: Every evening | ORAL | 0 refills | 30.00000 days | Status: AC
Start: 2023-11-11 — End: ?
  Filled 2023-11-11: qty 30, 30d supply, fill #1

## 2023-11-11 MED ORDER — HYDROXYZINE HCL 50 MG PO TAB
50 mg | ORAL_TABLET | ORAL | 0 refills | 30.00000 days | Status: AC | PRN
Start: 2023-11-11 — End: ?
  Filled 2023-11-11: qty 30, 8d supply, fill #1

## 2023-11-11 MED ORDER — NICOTINE 21 MG/24 HR TD PT24
1 | MEDICATED_PATCH | Freq: Every day | TRANSDERMAL | 0 refills | 28.00000 days | Status: DC
Start: 2023-11-11 — End: 2023-11-11

## 2023-11-11 MED ORDER — MELATONIN 3 MG PO TAB
6 mg | Freq: Every evening | ORAL | 0 refills | 28.00000 days | Status: AC
Start: 2023-11-11 — End: ?

## 2023-11-11 MED ORDER — NICOTINE (POLACRILEX) 4 MG BU GUM
4 mg | CHEWING_GUM | BUCCAL | 0 refills | 28.00000 days | Status: DC | PRN
Start: 2023-11-11 — End: 2023-11-11

## 2023-11-11 MED ORDER — NICOTINE 21 MG/24 HR TD PT24
1 | MEDICATED_PATCH | Freq: Every day | TRANSDERMAL | 0 refills | 28.00000 days | Status: AC
Start: 2023-11-11 — End: ?

## 2023-11-11 NOTE — Discharge Instructions
 Case Management Discharge Instructions:  -Follow up with scheduled appointments as recommended.  -Take medications as prescribed  -Follow up with medication management as recommended.  -Utilize individual and group therapy as needed.  -Continue using daily coping skills.   -In case of crisis contact 911 or nearest hospital emergency room.      Appointment Information:  Patient encouraged to follow at the [facility] for outpatient services.      [facility]  [facility]  [facility]  [address]  [place], [place] [address]  Phone: [phone]    [facility]  [facility]  [address], [address]  [place], [place] [address]  Phone: [phone]    [facility]  [facility]  [address]  [place], [place] [address]  Phone: [phone]         Other Follow Up Information:  National Suicide Prevention Lifeline: 206-492-7327 or you can text 988.   We can all help prevent suicide. The Lifeline provides 24/7, free and confidential support for people in distress, prevention and crisis resources for you or your loved ones, and best practices for professionals.      Crisis Text Line: Text HELLO or HOME to 25  Crisis Text Line serves anyone, in any type of crisis, providing access to free, 24/7 support and information via a medium people already use and trust: text.     Emergency Services are provided at the [facility] by clinical staff 24 hours per day, 7 days per week for those needing immediate attention.  After-hours services are available to clients and referral sources for consultation, problem resolution and, when needed, face-to-face intervention. To contact a member of the Emergency Services team:   During the day call: [phone]  After hours call: [phone]

## 2023-11-11 NOTE — Other
 Safety Plan   Name: [name]          MRN: [id number]              DOB: 11/18/1985        Age: 38 y.o.  Admission Date: 11/09/2023             Step 1: WHAT I LOOK FORWARD TO IN MY FUTURE are:  My sobriety  (future accomplishments)      Step 2: What are my protective factors? (loved ones, pets, things you look forward to, faith, job, supportive people, fear of death)   My son and my friend    Step 3: What are TRIGGERS that I can avoid that may result in thoughts of harm to myself or others? (situations, people, behaviors, places, substance)  1. Substances  2. Urges to use  3. Certain places where drugs are available.    Step 4: What are my internal WARNING SIGNS of distress? (thoughts, images, moods, memories)  1. Anxiety worsens  2. Isolate from others  3. Shaky     Step 5: INTERNAL COPING STRATEGIES - What can I do on my own to help prevent myself from acting out on my thoughts to harm myself or others?     1. Spend time with my son  2. Practice mindfullness skills    Step 6: How can I attempt to have safer surroundings in my environment to prevent a crisis?  1. Take medications as prescribed  2. Utilize coping skills    Step 7: Among my supports, whom can I reach out to for help during a crisis?   Friend/Family I can call: [name]                           Phone: [phone]    NATIONAL SUICIDE PREVENTION [employer]: 1-800-SUICIDE (1.438-503-7529 or 1.316-452-7767 for deaf & hard of hearing available 24 hours/7days/week)    If a crisis develops, and/or I want to harm myself or others, and using the above safety plan is NOT EFFECTIVE, I agree to immediately do the following:     1. Either remove myself from the presence of weapons, medications, or other means of harming myself, or ask a trusted friend/family member to remove such things from my home.   2. Call 911 or go the nearest hospital emergency room.  Additional Contacts:   The Veterans Suicide Hotline (Veterans Crisis Line): 585-337-1845, press 1 or text to 619-696-9880 (available 24 hours a day, seven days a week):  [employer] online chat: https://sampson.info/  [employer] website: http://www.montgomery.info/  Lesbian, Gay, Bisexual, Education administrator and Questioning (LGBTQ) Suicide Hotline ([employer]):   602-751-6858 (available 24 hours a day, seven days a week)  [employer] online chat: DyeTool.be (Available 7 days a week (3:00 p.m. - 9:00 p.m. ET / 12:00 p.m. - 6:00 p.m. PT).)  [employer] text messaging: Text the word [name] to [phone] (Available on Fridays (4:00 p.m. - 8:00 p.m. ET / 1:00 p.m. - 5:00 p.m. PT)

## 2023-11-11 NOTE — Care Plan
 Pt to be discharged today.  Pt is agreeable to the safe discharge plan implemented by the treatment team.  Pt denies SI AVH at this time.      Problem: Discharge Planning  Goal: Participation in plan of care  Outcome: Goal Achieved  Goal: Knowledge regarding plan of care  Outcome: Goal Achieved  Goal: Prepared for discharge  Outcome: Goal Achieved     Problem: Tobacco Use  Goal: Knowledge of tobacco-use cessation methods  Outcome: Goal Achieved     Problem: Harm to self, high risk of suicide  Goal: Absence of Harm to Self  Outcome: Goal Achieved     Problem: Mood - Altered  Goal: Stabilize mood  Outcome: Goal Achieved  Goal: Knowledge of Altered Mood  Outcome: Goal Achieved     Problem: Self-esteem - Low  Goal: Demonstration of positive self-esteem  Outcome: Goal Achieved  Goal: Knowledge of low self-esteem  Outcome: Goal Achieved     Problem: Thought Process - Altered  Goal: Demonstration of organized thought processes  Outcome: Goal Achieved     Problem: Violence, self/other-directed, Risk of  Goal: Absence of violence  Outcome: Goal Achieved  Goal: Knowledge of risk for violence, self/other-directed  Outcome: Goal Achieved     Problem: Transition Readiness  Goal: Knowledge of transition readiness  Outcome: Goal Achieved     Problem: High Fall Risk  Goal: High Fall Risk  Outcome: Goal Achieved

## 2023-11-11 NOTE — Progress Notes
 1615    .Ashley AasAaron AasDischarge Note:  Condition  Patient's mood and thought content has improved and is appropriate for discharge.   Disposition   Patient is leaving [facility] with a friend by personal vehicle to home.  Pt denies SI AVH at this time..   All items from room, belongings bin, medication room, and safe have been returned to the patient.  Discharge Instructions  Discharge instructions and psychiatry summary of care were discussed and provided. A copy will be transmitted to the next level of care provider within  24 hours after discharge.   Patient/family expresses understanding.'

## 2023-11-11 NOTE — Progress Notes
 Psychotherapy Progress Note    NAME:Mercedees Laraib Caspar MRN: [id number] DOB:1985-09-15 AGE: 38 y.o.  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     Narrative:  Therapist approached Patient in her room and she declined a session.  Later in the day, this Therapist spoke to her briefly as she was preparing for discharge.  She expressed being excited and ready to leave [facility].    Notable MSE Observations: During the second interaction, eye contact WNL.  Affect full; mood congruent    Progress: Moderate progress towards therapeutic goals.    Follow up:  No follow-up needed. Patient to discharge today and is encouraged to follow-up with aftercare providers.

## 2023-11-11 NOTE — Behavioral Health Treatment Team
 TREATMENT TEAM NOTE  Name: [name]          MRN: [id number]              DOB: 1985-08-10          Age: 38 y.o.  Admission Date: 11/09/2023               Attendees:  Case Manager: Bill Budd, LMSW  Therapy: Ora Billing, [facility], LCP  Psychology Fellow: Rumaldo Countess, PhD  UR: Thom Fleeting, LMSW  Psychiatry: Warden Ha, MD    Significant Points Discussed:   Calm/cooperative.  Present in the Milieu  Anxiety and depression = 6.  Guarded at times  Had been at a sober living facility  Signed an ROI for mother  Medications restarted over the weekend    Treatment Plan Discussion:   Continue treatment    Projected Discharge Date:   Discharge is being considered for middle to late this week

## 2023-11-11 NOTE — Progress Notes
 Case Management Progress Note    NAME:[name] MRN: [id number] DOB:[birth year] AGE: 38 y.o.  ADMISSION DATE: 11/09/2023 DAYS ADMITTED: LOS: 2 days     Date of Service: 11/11/2023  Service Start Time:  12:50pm  Service End Time:  1:08pm    Writer met with patient in small consultation room to discuss discharge and safety planning. Patient stated that she would be staying with her friend Siegfried Dress in [place] and he would be picking her up. Patient stated that he would assist in keeping her accountable and in obtaining outpatient supports. Patient and Clinical research associate discussed outpatient mental health options and resources which patient was agreeable to.

## 2023-11-11 NOTE — Discharge Instructions - Pharmacy
 Discharge Summary      Name: [name]  Medical Record Number: [id number]        Account Number:  [id number]  Date Of Birth:  1985/07/24                         Age:  38 y.o.   Admit date:  11/09/2023                     Discharge date:  11/11/2023  Discharge Attending: Dr. Warden Ha  Discharge Summary Completed By: Rivka Chesterfield, DO    Service: [facility]    Reason for hospitalization:  Suicidal ideation [R45.851]    Primary Discharge Diagnosis:   Suicidal ideation    Hospital Diagnoses:  Hospital Problems        Active Problems    Severe episode of recurrent major depressive disorder, without psychotic   features (CMS-HCC)    Chronic post-traumatic stress disorder (PTSD)    GAD (generalized anxiety disorder)    Borderline personality disorder (CMS-HCC)    Nicotine dependence    Methamphetamine use disorder, severe, dependence (CMS-HCC)    Cocaine use disorder, severe, dependence (CMS-HCC)       Resolved Problems    * (Principal) RESOLVED: Suicidal ideation    RESOLVED: Adjustment disorder with depressed mood     Present on Admission:   (Resolved) Adjustment disorder with depressed mood   Severe episode of recurrent major depressive disorder, 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)   Chronic post-traumatic stress disorder (PTSD)   GAD (generalized anxiety disorder)   Borderline personality disorder (CMS-HCC)   Nicotine dependence   (Resolved) Suicidal ideation      Significant Past Medical History        ADHD (attention deficit hyperactivity disorder)  Anxiety  Bladder infection, chronic  Degenerative disc disease, lumbar  Degenerative disc disease, thoracic  Depression  Gastrointestinal disorder      Comment:  GERD  Generalized headaches  GERD (gastroesophageal reflux disease)  Heart murmur  Infection with methicillin-resistant Staphylococcus aureus (MRSA)  Learning disorder  MRSA (methicillin resistant Staphylococcus aureus)      Comment:  right great toenail, (1st negative 01/07/18 [place])  Nausea  Osteoarthritis  Osteoporosis  Other nonspecific abnormal finding  PCOS (polycystic ovarian syndrome)  Seizure (CMS-HCC)      Comment:  stress-induced; non-epileptic  Stomach ulcer  Tachycardia      Comment:  r/t anxiety/panic attack  Unspecified cardiovascular disease  Unspecified mental or behavioral problem    Allergies   Mushroom, Asa [aspirin], Coconut, Nsaids (non-steroidal anti-inflammatory drug), Prozac [fluoxetine], Sulfa (sulfonamide antibiotics), Yellow dye, and Pristiq [desvenlafaxine succinate]    Brief Hospital Course   [name] is a 38 y.o. Caucasian female with a history of Polysubstance abuse (meth, cocaine), Borderline Personality Disorder, PTSD who presented to [facility] on 4/26 after endorsing suicidal ideation with plan to [facility]. This appears to be precipitated by recent relapse after visiting family.  Patient reported ongoing treatment at [facility] for longstanding methamphetamine and cocaine abuse, but had relapsed on 4/20 while traveling to [place] to see family.  Due to this relapse, patient had began experiencing intense SI with plan to drown herself in the bathtub at this facility.  Patient had also described VH of seeing shadows, but had denied any overt symptoms of psychosis outside of ongoing substance use.  Patient had reported medication compliance PTA, but did not  believe her medication regimen was largely beneficial at this time.  Patient had also reported ongoing homelessness as well.    Patient was admitted and started on Abilify 10 mg daily, lamotrigine 25 mg nightly, and mirtazapine 15 mg nightly.  Patient is lamotrigine was later increased to 50 mg nightly on 4/28 as she had reported taking 25 mg for 2 weeks and had been on 50 mg for about 1 week PTA.  Patient's PTA Seroquel and Lexapro were discontinued following admission due to reported ineffectiveness and concern for dual antipsychotic therapy.  Patient had reported Abilify Maintena being beneficial for her in the past, but there were no indications for this medication during the hospital course so this medication was deferred.  Patient tolerated this medication regimen well without any reported side effects or adverse reactions. Patient participated in interviews, individual/group therapy, and safety planning during admission. Patient was evaluated by Internal Medicine and followed throughout the admission.    Patient noted improvement in mood and resolution in suicidal thoughts throughout the admission and it was decided they are appropriate for discharge without grounds to hold. On day of discharge, their mood was better and they denied SI/HI/AVH. [facility].    The patient required: No room lockout, red zone, restraints, agitation PRN's, or other behavioral issues    The patient has: not exhausted therapeutic benefit from admission to this facility and future readmission would likely be appropriate if indicated     Day of discharge exam notable for:   General/Constitutional:  38 y.o.  female, appears stated age, fair hygiene, fair grooming.   Eye Contact: appropriate.  Behavior: calm, cooperative, no distress.  Motor: no psychomotor changes.  Speech: regular rate, rhythm, and tone. Appropriate volume.  Good articulation  Mood: Better  Affect: Restricted, narrow range, mood congruent  Thought Process: linear and goal directed.  Thought Content: Denies SI/HI. denies evidence of delusions.  Perception: denies AVH. Does not appear to respond to internal stimuli.  Associations: grossly intact.  Insight/Judgment: Poor/fair.     Orientation: grossly oriented.  Recent and remote memory: appropriate.  Attention span and concentration: appropriate.  Language: average.  Fund of knowledge and vocabulary: average.    Items Needing Follow Up   Pending items or areas that need to be addressed at follow up:     Consideration of inpatient substance use rehab/sober living facility for continued sobriety postdischarge    Pending Labs and Follow Up Radiology   Pending labs and/or radiology review at this time of discharge are listed below: if this area is blank, there are no items for review.         Medications   Is the patient being discharged on two or more antipsychotic medications? No    Metabolic Monitoring:         Body mass index is 30.28 kg/m?Aaron Aas  Wt Readings from Last 3 Encounters:   11/09/23 87.7 kg (193 lb 4.8 oz)   01/10/23 89.5 kg (197 lb 6.4 oz)   12/27/22 89.8 kg (198 lb)     BP Readings from Last 3 Encounters:   11/11/23 111/51   01/10/23 114/62   10/30/22 (!) 168/86     Lab Results   Component Value Date    CHOL 134 12/10/2019    TRIG 70 12/10/2019    HDL 50 12/10/2019    LDL 80 12/10/2019    VLDL 14 12/10/2019    NONHDLCHOL 84 12/10/2019  Lab Results   Component Value Date    HGBA1C 5.1 12/10/2019       Patient instructions/medications:   Medication List      START taking these medications     hydrOXYzine HCL 50 mg tablet; Commonly known as: ATARAX; Dose: 50 mg;   Take one tablet by mouth every 6 hours as needed. Indications: anxious;   For: anxious; Quantity: 30 tablet; Refills: 0   melatonin 3 mg tablet; Commonly known as: MELATIN; Dose: 6 mg; Take two   tablets by mouth at bedtime daily. Indications: difficulty sleeping; For:   difficulty sleeping; Refills: 0   mirtazapine 15 mg tablet; Commonly known as: REMERON; Dose: 15 mg; Take   one tablet by mouth at bedtime daily. Indications: major depressive   disorder; For: major depressive disorder; Quantity: 30 tablet; Refills: 0   nicotine 21 mg/day patch; Commonly known as: NICODERM CQ; Dose: 1 patch;   Apply one patch to top of skin as directed daily. Rotate patch location.    Indications: stop smoking; For: stop smoking; Quantity: 28 patch; Refills:   0; Start taking on: November 12, 2023   nicotine polacrilex 4 mg gum; Commonly known as: NICORETTE; Dose: 4 mg;   Place one Gum inside cheek (side of mouth) every 2 hours as needed. Chew   to soften and park in mouth between lip and gum. May use 1 piece per hour,   not to exceed 24 per day for 12 weeks. May be used longer, if needed.    Indications: stop smoking; For: stop smoking; Quantity: 220 Gum; Refills:   0     CHANGE how you take these medications     ARIPiprazole 10 mg tablet; Commonly known as: ABILIFY; Dose: 10 mg; Take   one tablet by mouth at bedtime daily. Indications: additional treatment   for major depressive disorder; For: additional treatment for major   depressive disorder; Quantity: 30 tablet; Refills: 0; What changed:   medication strength, how much to take, when to take this     CONTINUE taking these medications     CHOLEcalciferoL (vitamin D3) 1,000 units tablet; Dose: 1,000 Units;   Refills: 0   lamoTRIgine 25 mg tablet; Commonly known as: LaMICtal; Dose: 50 mg;   Refills: 0   omeprazole DR 40 mg capsule; Commonly known as: PriLOSEC; Dose: 40 mg;   Take one capsule by mouth twice daily. Open capsule and mix in applesauce.    Take twice daily.; Quantity: 180 capsule; Refills: 1   ondansetron 4 mg rapid dissolve tablet; Commonly known as: ZOFRAN ODT;   Dose: 4 mg; Dissolve one tablet by mouth every 8 hours as needed for   Nausea or Vomiting. Place on tongue to dissolve.; Quantity: 60 tablet;   Refills: 0   sucralfate 1 gram tablet; Commonly known as: CARAFATE; Dose: 1 g; Take   one tablet by mouth every 6 hours. Take on an empty stomach. Make into   slurry, adding enough water to make a syrup like substance.; Quantity: 360   tablet; Refills: 0     STOP taking these medications     cyanocobalamin(DIL) (vitamin B-12) 100 mcg/mL   escitalopram oxalate 10 mg tablet; Commonly known as: LEXAPRO   meloxicam 15 mg tablet; Commonly known as: MOBIC   QUEtiapine 25 mg tablet; Commonly known as: SEROquel   QUEtiapine 50 mg tablet; Commonly known as: SEROquel       Return Appointments and Scheduled Appointments   No appointment scheduled  Consults, Procedures, Diagnostics, Micro, Pathology   Consults: Internal Medicine  Surgical Procedures & Dates: None  Significant Diagnostic Studies, Micro and Procedures: none  Significant Pathology: none                       Discharge Disposition, Condition   Patient Disposition: Home or Self Care [01]  Condition at Discharge: Stable    Code Status   Full Code    Patient Instructions     Activity       Activity as Tolerated   As directed      It is important to keep increasing your activity level after you leave the hospital.  Moving around can help prevent blood clots, lung infection (pneumonia) and other problems.  Gradually increasing the number of times you are up moving around will help you return to your normal activity level more quickly.  Continue to increase the number of times you are up to the chair and walking daily to return to your normal activity level. Begin to work toward your normal activity level at discharge          Diet       Regular Diet   As directed      You have no dietary restriction. Please continue with a healthy balanced diet.              Discharge education provided to patient., Signs and Symptoms:   Report these signs and symptoms       Report These Signs and Symptoms   As directed      Please contact your doctor if you have any of the following symptoms: thoughts of harming yourself or others        , and Education:     Additional Orders: Case Management, Supplies, Home Health     Home Health/DME       None              Signed:  Rivka Chesterfield, DO  11/11/2023      cc:  Primary Care Physician:  No Pcp, Na   No PCP  Referring physicians:  Self, Referral   Additional provider(s):        Did we miss something? If additional records are needed, please fax a request on office letterhead to [phone]. Please include the patient's name, date of birth, fax number and type of information needed. Additional request can be made by email at [email or web address]. For general questions of information about electronic records sharing, call [phone].

## 2023-11-11 NOTE — Progress Notes
 4540      PRN Medication Administered:  Hydroxyzine 50 mg    Non pharmacological interventions attempted prior to PRN medication administration:  Verbal de-escalation techniques, Decrease stimuli, and Increase structure and limit setting,discussed coping skills    Brief narrative of reason for PRN medication administration:   Pt reporting increased anxiety, she cannot discern a causative factor at this time.  Pt medicated with the above cited med as ordered and charted.

## 2023-11-11 NOTE — Group Note
 Name: [name]   MRN: [id number]     DOB: Apr 03, 1986      Age: 38 y.o.  Admission Date: 11/09/2023     LOS: 2 days     Date of Service: 11/11/2023      Group Topic: BH Coping Skills  Group Date: 11/11/2023  Start Time: 0945  End Time: 1045  Facilitators: Criss Dom          Number of Participants: 14  Group Focus: Feeling Identification  Treatment Modality: Art Therapy  Interventions utilized were assignment and exploration  Purpose: enhance coping skills and express feelings    Patients used watercolor to create pictures of their current or desired mood state through images of weather.  Activity is intended to promote creative expression, feeling identification and emotion regulation.    Name: [name] Date of Birth: [birth year]   MR: [id number]      Level of Participation: moderate   Quality of Participation: cooperative  Interactions with others: gave feedback and pulled from group for clinical purposes  Mood/Affect: appropriate  Cognition: coherent/clear  Progress: Moderate  Response: Moderately engaged while present  Plan: to continue treatment

## 2023-11-11 NOTE — Case Management (ED)
 Inpatient Medication Voucher Assessment    Date:  11/11/2023    Primary Insurance: Yes  Rx Coverage: No  Medicaid pending: No  Medicare Part D [place]: No  VA Benefits (include location of services if applicable): No    Is patient eligible for a Medication Voucher? Patient's insurance is locked into a specific pharmacy.     Date of last Medication Voucher: N/A  Charitable Fund to be utilized (include name of fund if applicable): YES  Narcotics can be included on voucher: NO  Generic Drug List ($4.99) medications and OTC medications can be included on voucher : YES  Refill authorized: not applicable at [employer] [facility]  Name of medication(s) to be refilled: not applicable at [facility]  Other information:     Reminders:   SW has authorized a Medication Voucher for up to $150.  If Voucher is over this amount, please contact SW.    Vouchers are to be provided once during a 24-month period.   Vouchers cannot include nicotine patches or co-pays.    Vouchers should include only new medications; ongoing refills should not be authorized without long-term plan for payment of the medication documented in this note.  No more than a 30-day supply should be authorized. Please contact SW with needs outside of these parameters.    Tayler Lassen C. Sandralee Crow, LMSW

## 2023-11-11 NOTE — Discharge Planning (AHS/AVS)
 Case Management Discharge Instructions:  -Follow up with scheduled appointments as recommended.  -Take medications as prescribed  -Follow up with medication management as recommended.  -Utilize individual and group therapy as needed.  -Continue using daily coping skills.   -In case of crisis contact 911 or nearest hospital emergency room.      Appointment Information:  Patient encouraged to follow at the [facility] for outpatient services.      [facility]  [facility]  [facility]  [address]  [place], [place] [address]  Phone: [phone]    [facility]  [facility]  [address], [address]  [place], [place] [address]  Phone: [phone]    [facility]  [facility]  [address]  [place], [place] [address]  Phone: [phone]         Other Follow Up Information:  National Suicide Prevention Lifeline: 206-492-7327 or you can text 988.   We can all help prevent suicide. The Lifeline provides 24/7, free and confidential support for people in distress, prevention and crisis resources for you or your loved ones, and best practices for professionals.      Crisis Text Line: Text HELLO or HOME to 25  Crisis Text Line serves anyone, in any type of crisis, providing access to free, 24/7 support and information via a medium people already use and trust: text.     Emergency Services are provided at the [facility] by clinical staff 24 hours per day, 7 days per week for those needing immediate attention.  After-hours services are available to clients and referral sources for consultation, problem resolution and, when needed, face-to-face intervention. To contact a member of the Emergency Services team:   During the day call: [phone]  After hours call: [phone]

## 2023-11-11 NOTE — Group Note
 Name: [name]   MRN: [id number]     DOB: 10-03-1985      Age: 38 y.o.  Admission Date: 11/09/2023     LOS: 2 days     Date of Service: 11/11/2023      Group Topic: BH Leisure Activities  Group Date: 11/11/2023  Start Time: 1445  End Time: 1545  Facilitators: Isla Mari          Number of Participants: 7  Group Focus: coping skills, leisure skills, and social skills  Treatment Modality: Drama Therapy  Interventions utilized were Courtyard  Purpose: enhance coping skills      Courtyard - Patients with off unit privileges were accompanied by expressive staff and unit staff to outdoor courtyard. Patients were provided with outdoor activities, such as corn hole and sidewalk chalk, to engage in leisure activities. Purpose of group is to provide opportunities for relaxation, self-care, social skills, change of environment, and leisure participation.     Name: [name] Date of Birth: 11/29/1985   MR: [id number]      Level of Participation: patient was unable to participate in group due to upcoming discharge  Plan: to continue treatment

## 2023-11-13 ENCOUNTER — Encounter: Admit: 2023-11-13 | Discharge: 2023-11-13 | Payer: MEDICAID

## 2023-11-13 NOTE — Telephone Encounter
 Confirmation this is the pt the call is intended for (name/DOB): no    Any questions about DC instructions? n/a    Did you have any difficulty filling Rx's? n/a    Any questions about follow-up appointments? n/a    Did you receive all items at discharge that you came in with?: n/a    Any other questions/comments:     Refused call

## 2023-11-29 ENCOUNTER — Encounter: Admit: 2023-11-29 | Discharge: 2023-11-29 | Payer: MEDICAID

## 2024-06-28 ENCOUNTER — Encounter: Admit: 2024-06-28 | Discharge: 2024-06-28 | Payer: PRIVATE HEALTH INSURANCE

## 2024-08-08 ENCOUNTER — Encounter: Admit: 2024-08-08 | Discharge: 2024-08-08 | Payer: PRIVATE HEALTH INSURANCE
